# Patient Record
Sex: Female | Born: 1940 | Race: Black or African American | Hispanic: No | Smoking: Never smoker
Health system: Southern US, Community
[De-identification: ages and names within clinical notes are randomized; demographics above are authoritative.]

## PROBLEM LIST (undated history)

## (undated) DIAGNOSIS — M1990 Unspecified osteoarthritis, unspecified site: Secondary | ICD-10-CM

## (undated) DIAGNOSIS — M069 Rheumatoid arthritis, unspecified: Secondary | ICD-10-CM

## (undated) DIAGNOSIS — I10 Essential (primary) hypertension: Secondary | ICD-10-CM

## (undated) DIAGNOSIS — R7303 Prediabetes: Secondary | ICD-10-CM

## (undated) DIAGNOSIS — I2510 Atherosclerotic heart disease of native coronary artery without angina pectoris: Secondary | ICD-10-CM

## (undated) DIAGNOSIS — D649 Anemia, unspecified: Secondary | ICD-10-CM

## (undated) HISTORY — DX: Anemia, unspecified: D64.9

## (undated) HISTORY — PX: PARTIAL HYSTERECTOMY: SHX80

## (undated) HISTORY — DX: Atherosclerotic heart disease of native coronary artery without angina pectoris: I25.10

## (undated) HISTORY — DX: Rheumatoid arthritis, unspecified: M06.9

## (undated) HISTORY — DX: Essential (primary) hypertension: I10

---

## 2002-09-15 ENCOUNTER — Encounter (INDEPENDENT_AMBULATORY_CARE_PROVIDER_SITE_OTHER): Payer: Self-pay | Admitting: Specialist

## 2002-09-15 ENCOUNTER — Encounter: Admission: RE | Admit: 2002-09-15 | Discharge: 2002-09-15 | Payer: Self-pay | Admitting: Family Medicine

## 2002-09-15 ENCOUNTER — Encounter: Payer: Self-pay | Admitting: Family Medicine

## 2003-10-04 ENCOUNTER — Ambulatory Visit (HOSPITAL_COMMUNITY): Admission: RE | Admit: 2003-10-04 | Discharge: 2003-10-04 | Payer: Self-pay | Admitting: Family Medicine

## 2004-10-15 ENCOUNTER — Ambulatory Visit (HOSPITAL_COMMUNITY): Admission: RE | Admit: 2004-10-15 | Discharge: 2004-10-15 | Payer: Self-pay | Admitting: Family Medicine

## 2005-07-06 HISTORY — PX: CORONARY ARTERY BYPASS GRAFT: SHX141

## 2005-09-25 ENCOUNTER — Ambulatory Visit: Payer: Self-pay | Admitting: Cardiology

## 2005-11-05 ENCOUNTER — Ambulatory Visit: Payer: Self-pay | Admitting: Cardiology

## 2005-11-20 ENCOUNTER — Encounter: Payer: Self-pay | Admitting: Vascular Surgery

## 2005-11-20 ENCOUNTER — Ambulatory Visit: Payer: Self-pay | Admitting: Internal Medicine

## 2005-11-20 ENCOUNTER — Ambulatory Visit (HOSPITAL_COMMUNITY): Admission: RE | Admit: 2005-11-20 | Discharge: 2005-11-20 | Payer: Self-pay | Admitting: Cardiothoracic Surgery

## 2005-11-20 ENCOUNTER — Inpatient Hospital Stay (HOSPITAL_BASED_OUTPATIENT_CLINIC_OR_DEPARTMENT_OTHER): Admission: RE | Admit: 2005-11-20 | Discharge: 2005-11-20 | Payer: Self-pay | Admitting: Orthopedic Surgery

## 2005-12-01 ENCOUNTER — Ambulatory Visit (HOSPITAL_COMMUNITY): Admission: RE | Admit: 2005-12-01 | Discharge: 2005-12-01 | Payer: Self-pay | Admitting: Family Medicine

## 2005-12-15 ENCOUNTER — Inpatient Hospital Stay (HOSPITAL_COMMUNITY): Admission: RE | Admit: 2005-12-15 | Discharge: 2005-12-20 | Payer: Self-pay | Admitting: Cardiothoracic Surgery

## 2005-12-23 ENCOUNTER — Encounter: Payer: Self-pay | Admitting: Cardiology

## 2005-12-29 ENCOUNTER — Ambulatory Visit: Payer: Self-pay | Admitting: Cardiology

## 2006-03-03 ENCOUNTER — Encounter: Payer: Self-pay | Admitting: Cardiology

## 2006-10-20 ENCOUNTER — Encounter: Payer: Self-pay | Admitting: Cardiology

## 2006-12-06 ENCOUNTER — Ambulatory Visit (HOSPITAL_COMMUNITY): Admission: RE | Admit: 2006-12-06 | Discharge: 2006-12-06 | Payer: Self-pay | Admitting: Family Medicine

## 2006-12-09 ENCOUNTER — Ambulatory Visit: Payer: Self-pay | Admitting: Cardiology

## 2007-12-13 ENCOUNTER — Ambulatory Visit (HOSPITAL_COMMUNITY): Admission: RE | Admit: 2007-12-13 | Discharge: 2007-12-13 | Payer: Self-pay | Admitting: Family Medicine

## 2008-03-13 ENCOUNTER — Ambulatory Visit: Payer: Self-pay | Admitting: Cardiology

## 2008-12-19 ENCOUNTER — Ambulatory Visit: Payer: Self-pay | Admitting: Cardiology

## 2009-01-02 ENCOUNTER — Ambulatory Visit (HOSPITAL_COMMUNITY): Admission: RE | Admit: 2009-01-02 | Discharge: 2009-01-02 | Payer: Self-pay | Admitting: Family Medicine

## 2009-03-19 DIAGNOSIS — I25810 Atherosclerosis of coronary artery bypass graft(s) without angina pectoris: Secondary | ICD-10-CM

## 2009-03-19 DIAGNOSIS — I10 Essential (primary) hypertension: Secondary | ICD-10-CM | POA: Insufficient documentation

## 2009-05-08 ENCOUNTER — Ambulatory Visit: Payer: Self-pay | Admitting: Cardiology

## 2009-05-08 ENCOUNTER — Encounter: Payer: Self-pay | Admitting: Cardiology

## 2009-05-14 ENCOUNTER — Ambulatory Visit: Payer: Self-pay | Admitting: Cardiology

## 2009-05-14 DIAGNOSIS — D649 Anemia, unspecified: Secondary | ICD-10-CM

## 2009-05-14 DIAGNOSIS — M069 Rheumatoid arthritis, unspecified: Secondary | ICD-10-CM | POA: Insufficient documentation

## 2009-05-14 DIAGNOSIS — R9430 Abnormal result of cardiovascular function study, unspecified: Secondary | ICD-10-CM | POA: Insufficient documentation

## 2009-05-14 DIAGNOSIS — R030 Elevated blood-pressure reading, without diagnosis of hypertension: Secondary | ICD-10-CM | POA: Insufficient documentation

## 2009-07-09 ENCOUNTER — Ambulatory Visit: Payer: Self-pay | Admitting: Cardiology

## 2009-07-09 DIAGNOSIS — E785 Hyperlipidemia, unspecified: Secondary | ICD-10-CM

## 2009-07-22 ENCOUNTER — Encounter: Payer: Self-pay | Admitting: Family Medicine

## 2010-01-02 ENCOUNTER — Telehealth (INDEPENDENT_AMBULATORY_CARE_PROVIDER_SITE_OTHER): Payer: Self-pay | Admitting: *Deleted

## 2010-01-07 ENCOUNTER — Ambulatory Visit (HOSPITAL_COMMUNITY): Admission: RE | Admit: 2010-01-07 | Discharge: 2010-01-07 | Payer: Self-pay | Admitting: Family Medicine

## 2010-05-12 ENCOUNTER — Encounter: Payer: Self-pay | Admitting: Cardiology

## 2010-07-09 ENCOUNTER — Encounter: Payer: Self-pay | Admitting: Cardiology

## 2010-08-05 NOTE — Letter (Signed)
Summary: [facility]  [facility]   Imported By: Maryln Gottron 08/23/2009 15:53:15  _____________________________________________________________________  External Attachment:    Type:   Image     Comment:   External Document

## 2010-08-05 NOTE — Assessment & Plan Note (Signed)
Summary: 6 WK FU -SRS   Visit Type:  Follow-up Primary Provider:  Dimas Aguas  CC:  follow-up visit.  History of Present Illness: the patient is a 56 her old female with history of coronary bypass grafting in June of 2007. She has normal LV function. The patient was seen in November because of an abnormal stress echocardiographic study. However the patient had no chest pain during stress testing and ejection fraction greater than 65%. There was evidence of ischemia in the mid anterior mid inferoseptal and apical segments. However given the absence of symptoms I opted to treat the patient initially medically. Of note was that her blood pressure was also very poorly controlled although she claims she has whitecoat hypertension. We started the patient on amlodipine, Imdur and Pravachol. She states that she is doing very well. She has no chest pain orthopnea PND cough vision or syncope. She has good exercise tolerance.  Clinical Review Panels:  Stress Echocardiogram Stress Echocardiogram Bruce protocol. Patient exercised into stage 209% of maximum predicted heart rate. The patient achieved 4 metastases. There is a 1.5 mm ST segment depression in leads 2, 3, aVF, V5, and V6. The patient denies any chest pain. Hypertensive blood pressure response. Positive echocardiographic stress test.  Positive echocardiographic stress test. Overall normal LV function at peak with ejection fraction of greatest 5%. Mid anterior, mid inferoseptal, apical septal and apical anterior wall segments deteriorated. Echocardiographic findings are consistent with ischemia. (05/08/2009)  Cardiac Imaging Cardiac Cath Findings  ASSESSMENT:  1.  Three-vessel coronary artery disease with a significant left main and      ostial left anterior descending artery disease.  2.  Normal left ventricle function.  3.  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   PLAN:  1.  CVTS consult for bypass  surgery.  She now has an appointment set up for      next week with Dr. Donata Clay.  2.  Risk factor modification.  3.  Continue her beta blocker.  4.  We will add Imdur 30 and simvastatin 40.  5.  Should she have worsening symptoms, she will need to follow up with Dr.      Andee Lineman or be admitted to hospital.   Arvilla Meres, M.D. [facility] (11/20/2005)    Preventive Screening-Counseling & Management  Alcohol-Tobacco     Smoking Status: never  Current Medications (verified): 1)  Fish Oil 1000 Mg Caps (Omega-3 Fatty Acids) .... Take 1 Tablet By Mouth Once A Day 2)  Vitamin B-12 500 Mcg Tabs (Cyanocobalamin) .... Take 1 Tablet By Mouth Once A Day 3)  Aspirin 81 Mg Tbec (Aspirin) .... Take 1 Tablet By Mouth Once A Day 4)  Multivitamins  Tabs (Multiple Vitamin) .... Take One By Mouth 2-3 Times Per Week 5)  Vitamin D3 1000 Unit Caps (Cholecalciferol) .... Take 1 Tablet By Mouth Once A Day 6)  Prilosec Otc 20 Mg Tbec (Omeprazole Magnesium) .... Take 1 Tablet By Mouth Once A Day 7)  Atenolol 25 Mg Tabs (Atenolol) .... Take 1 Tablet By Mouth Two Times A Day 8)  Folic Acid 1 Mg Tabs (Folic Acid) .... Take 1 Tablet By Mouth Once A Day 9)  Methotrexate 2.5 Mg Tabs (Methotrexate Sodium) .... Take 8 Tablets By Mouth Weekly On Wednesdays 10)  Caltrate 600+d 600-400 Mg-Unit Tabs (Calcium Carbonate-Vitamin D) .... Take 1 Tablet By Mouth Two Times A Day 11)  Meloxicam 7.5 Mg Tabs (Meloxicam) .Marland KitchenMarland KitchenMarland Kitchen  Take 1 Tablet By Mouth Once A Day 12)  Soy Isoflavones Extract 50 Mg Caps (Soy Isoflavone) .... Take 1 Tablet By Mouth Once A Day 13)  Amlodipine Besylate 5 Mg Tabs (Amlodipine Besylate) .... Take 1 Tablet By Mouth Once A Day 14)  Isosorbide Mononitrate Cr 30 Mg Xr24h-Tab (Isosorbide Mononitrate) .... Take 1 Tablet By Mouth Once A Day 15)  Pravachol 20 Mg Tabs (Pravastatin Sodium) .... Take 1 Tab By Mouth At Bedtime  Allergies (verified): 1)  ! Pcn  Comments:  Nurse/Medical Assistant: The patient's  medications and allergies were reviewed with the patient and were updated in the Medication and Allergy Lists. Patient brought list to office.  Past History:  Past Medical History: Last updated: 05/14/2009 HYPERTENSION, UNSPECIFIED (ICD-401.9) CAD, ARTERY BYPASS GRAFT (ICD-414.04) History of anemia.  History of rheumatoid arthritis.  Elevated right-sided hemidiaphragm.  1. Coronary artery disease.     a.     Status post coronary artery bypass graft x3 on December 15, 2005.     b.     Preserved left ventricular function. 2. History of anemia, stable. 3. Hypertension (white coat hypertension). 4. History of rheumatoid arthritis.  Past Surgical History: Last updated: 03/19/2009 CABG  Family History: Last updated: 03/19/2009 Family History of Alcoholism:  Family History of Cancer:  Family History of Hypertension:   Social History: Last updated: 03/19/2009 Tobacco Use - No.   Risk Factors: Smoking Status: never (07/09/2009)  Review of Systems  The patient denies fatigue, malaise, fever, weight gain/loss, vision loss, decreased hearing, hoarseness, chest pain, palpitations, shortness of breath, prolonged cough, wheezing, sleep apnea, coughing up blood, abdominal pain, blood in stool, nausea, vomiting, diarrhea, heartburn, incontinence, blood in urine, muscle weakness, joint pain, leg swelling, rash, skin lesions, headache, fainting, dizziness, depression, anxiety, enlarged lymph nodes, easy bruising or bleeding, and environmental allergies.    Vital Signs:  Patient profile:   70 year old female Height:      67 inches Weight:      212 pounds Pulse rate:   66 / minute BP sitting:   139 / 80  (left arm) Cuff size:   large  Vitals Entered By: Carlye Grippe (July 09, 2009 1:09 PM) CC: follow-up visit   Physical Exam  General:  Well developed, well nourished, in no acute distress. Head:  normocephalic and atraumatic Eyes:  PERRLA/EOM intact; conjunctiva and lids  normal. Nose:  no deformity, discharge, inflammation, or lesions Mouth:  Teeth, gums and palate normal. Oral mucosa normal. Neck:  Neck supple, no JVD. No masses, thyromegaly or abnormal cervical nodes. Lungs:  Clear bilaterally to auscultation and percussion. Heart:  Non-displaced PMI, chest non-tender; regular rate and rhythm, S1, S2 without murmurs, rubs or gallops. Carotid upstroke normal, no bruit. Normal abdominal aortic size, no bruits. Femorals normal pulses, no bruits. Pedals normal pulses. No edema, no varicosities. Abdomen:  Bowel sounds positive; abdomen soft and non-tender without masses, organomegaly, or hernias noted. No hepatosplenomegaly. Msk:  Back normal, normal gait. Muscle strength and tone normal. Pulses:  pulses normal in all 4 extremities Extremities:  No clubbing or cyanosis. Neurologic:  Alert and oriented x 3. Skin:  Intact without lesions or rashes. Cervical Nodes:  no significant adenopathy Psych:  Normal affect.   Impression & Recommendations:  Problem # 1:  HYPERTENSION, WHITE COAT (ICD-796.2) blood pressures under significantly better control. We will continue her current medical regimen.  Problem # 2:  NONSPECIFIC ABNORMAL UNSPEC CV FUNCTION STUDY (  ICD-794.30) although the patient abnormal stress study I feel this is low risk and the patient is asymptomatic. There is no immediate indication for cardiac catheterization  Problem # 3:  CAD, ARTERY BYPASS GRAFT (ICD-414.04) Assessment: Comment Only  Her updated medication list for this problem includes:    Aspirin 81 Mg Tbec (Aspirin) .Marland Kitchen... Take 1 tablet by mouth once a day    Atenolol 25 Mg Tabs (Atenolol) .Marland Kitchen... Take 1 tablet by mouth two times a day    Amlodipine Besylate 5 Mg Tabs (Amlodipine besylate) .Marland Kitchen... Take 1 tablet by mouth once a day    Isosorbide Mononitrate Cr 30 Mg Xr24h-tab (Isosorbide mononitrate) .Marland Kitchen... Take 1 tablet by mouth once a day  Orders: EKG w/ Interpretation (93000)  Problem #  4:  ARTHRITIS, RHEUMATOID (ICD-714.0) Assessment: Comment Only  Problem # 5:  DYSLIPIDEMIA (ICD-272.4) patient can followup with her primary care physician regarding his future lipid panel and LFTs. Her updated medication list for this problem includes:    Pravachol 20 Mg Tabs (Pravastatin sodium) .Marland Kitchen... Take 1 tab by mouth at bedtime  Patient Instructions: 1)  Your physician recommends that you continue on your current medications as directed. Please refer to the Current Medication list given to you today. 2)  Follow up in 1 year.   Prescriptions: PRAVACHOL 20 MG TABS (PRAVASTATIN SODIUM) Take 1 tab by mouth at bedtime  #90 x 3   Entered by:   Hoover Brunette, LPN   Authorized by:   Lewayne Bunting, MD, [facility]   Signed by:   Hoover Brunette, LPN on 16/04/9603   Method used:   Electronically to        [facility] (retail)       [address]       [place], [place]  [address]       Ph: [phone]       Fax: [phone]   RxID:   [id number] ISOSORBIDE MONONITRATE CR 30 MG XR24H-TAB (ISOSORBIDE MONONITRATE) Take 1 tablet by mouth once a day  #90 x 3   Entered by:   Hoover Brunette, LPN   Authorized by:   Lewayne Bunting, MD, [facility]   Signed by:   Hoover Brunette, LPN on 05/02/2535   Method used:   Electronically to        [facility] (retail)       [address]       [place], [place]  [address]       Ph: [phone]       Fax: [phone]   RxID:   [id number] AMLODIPINE BESYLATE 5 MG TABS (AMLODIPINE BESYLATE) Take 1 tablet by mouth once a day  #90 x 3   Entered by:   Hoover Brunette, LPN   Authorized by:   Lewayne Bunting, MD, [facility]   Signed by:   Hoover Brunette, LPN on 32/35/5732   Method used:   Electronically to        [facility] (retail)       [address]       [place], [place]  [address]       Ph: [phone]       Fax: [phone]   RxID:   [id number]

## 2010-08-05 NOTE — Letter (Signed)
Summary: External Correspondence/ OFFICE VISIT [facility]  External Correspondence/ [facility]ICE VISIT [facility]   Imported By: Dorise Hiss 05/13/2010 10:46:21  _____________________________________________________________________  External Attachment:    Type:   Image     Comment:   External Document

## 2010-08-05 NOTE — Progress Notes (Signed)
Summary: ?  UTI  Phone Note Call from Patient   Summary of Call: c/o having dark urine, bad odor, feels like may be geginnings of UTI.  States that she did drink cranberry juice yesterday and feels a little better this morning.  Thinks her Amlodipine may be causing this.  Advised not likely considering her symptoms and that she has been on this med since January.   Advised her to call PMD to have urine checked to r/o UTI.  Patient verbalized understanding.  Initial call taken by: Hoover Brunette, LPN,  January 02, 2010 9:14 AM

## 2010-08-05 NOTE — Miscellaneous (Signed)
Summary: EKG  Clinical Lists Changes  Observations: Added new observation of EKG INTERP: normal sinus rhythm. Left atrial enlargement. Nonspecific T-wave changes. (07/09/2009 13:41)      EKG  Procedure date:  07/09/2009  Findings:      normal sinus rhythm. Left atrial enlargement. Nonspecific T-wave changes.

## 2010-08-07 NOTE — Letter (Signed)
Summary: Appointment- Rescheduled  [facility] [address]   [place], [place] [address]   Phone: [phone]  Fax: [phone]     July 09, 2010 MRN: [id number]     Penny [name] [address] [place], [place]  [address]     Dear Ms. Miranda,   Due to a change in our office schedule, your appointment on August 12, 2010 at  1:45 PM must be changed.    Your new appointment will be August 27, 2010 at 2:00 PM.   We look forward to participating in your health care needs.      Sincerely,  Glass blower/designer

## 2010-08-27 ENCOUNTER — Encounter: Payer: Self-pay | Admitting: Cardiology

## 2010-08-27 ENCOUNTER — Ambulatory Visit (INDEPENDENT_AMBULATORY_CARE_PROVIDER_SITE_OTHER): Payer: Medicare Other | Admitting: Cardiology

## 2010-08-27 DIAGNOSIS — I2510 Atherosclerotic heart disease of native coronary artery without angina pectoris: Secondary | ICD-10-CM

## 2010-09-02 NOTE — Assessment & Plan Note (Signed)
Summary: 1 yr fu recv reminder-had to r/s due to EPIC go alive -vts   Visit Type:  Follow-up Primary Provider:  Dimas Aguas   History of Present Illness: The patient is a 70 year old female with a history of bypass surgery in 2007.  Shows normal LV function.  In November of 2010 showed an abnormal stress echocardiographic study.  The patient however had no chest pain and her ejection fraction was greater than 65%.  There was some evidence of ischemia in the mid anterior, mid inferoseptal and apical segments.  However, given the absence of symptoms I opted to treat the patient initially medically.  At that time her blood pressure was also very poorly controlled and she felt that she had white coat hypertension.  She was started on amlodipine, isosorbide mononitrate and Pravachol.  At that time she was doing well.  She has not required any hospitalizations. The patient has a history of hypertension, dyslipidemia and reflux as well as rheumatoid arthritis. The patient has taken herself off amlodipine because of lower extremity edema.  She denies any chest pain, shortness of breath, palpitations, orthopnea or PND.  She does not use any sublingual nitroglycerin.  She has been watching her diet and has lost some weight.  She was instructed by Dr. Dimas Aguas to be careful and to monitor her blood sugar. EKG was reviewed today and was essentially within normal limits   Preventive Screening-Counseling & Management  Alcohol-Tobacco     Smoking Status: never  Current Medications (verified): 1)  Fish Oil 1000 Mg Caps (Omega-3 Fatty Acids) .... Take 1 Tablet By Mouth Once A Day 2)  Vitamin B-12 500 Mcg Tabs (Cyanocobalamin) .... Take 1 Tablet By Mouth Once A Day 3)  Aspirin 81 Mg Tbec (Aspirin) .... Take 1 Tablet By Mouth Once A Day 4)  Multivitamins  Tabs (Multiple Vitamin) .... Take One By Mouth 2-3 Times Per Week 5)  Vitamin D3 1000 Unit Caps (Cholecalciferol) .... Take 1 Tablet By Mouth Once A Day 6)   Prilosec Otc 20 Mg Tbec (Omeprazole Magnesium) .... Take 1 Tablet By Mouth Once A Day 7)  Atenolol 25 Mg Tabs (Atenolol) .... Take 1 Tablet By Mouth Two Times A Day 8)  Folic Acid 1 Mg Tabs (Folic Acid) .... Take 1 Tablet By Mouth Once A Day 9)  Methotrexate 2.5 Mg Tabs (Methotrexate Sodium) .... Take 8 Tablets By Mouth Weekly On Wednesdays 10)  Caltrate 600+d 600-400 Mg-Unit Tabs (Calcium Carbonate-Vitamin D) .... Take 1 Tablet By Mouth Two Times A Day 11)  Meloxicam 7.5 Mg Tabs (Meloxicam) .... Take 1 Tablet By Mouth Once A Day 12)  Soy Isoflavones Extract 50 Mg Caps (Soy Isoflavone) .... Take 1 Tablet By Mouth Once A Day 13)  Isosorbide Mononitrate Cr 30 Mg Xr24h-Tab (Isosorbide Mononitrate) .... Take 1 Tablet By Mouth Once A Day 14)  Pravachol 20 Mg Tabs (Pravastatin Sodium) .... Take 1 Tab By Mouth At Bedtime  Allergies: 1)  ! Pcn 2)  * Norvasc  Comments:  Nurse/Medical Assistant: The patient's medications were reviewed with the patient and were updated in the Medication List. Pt brought a list of medications to office visit.  Cyril Loosen, RN, BSN (August 27, 2010 2:24 PM)  Past History:  Past Medical History: Last updated: 05/14/2009 HYPERTENSION, UNSPECIFIED (ICD-401.9) CAD, ARTERY BYPASS GRAFT (ICD-414.04) History of anemia.  History of rheumatoid arthritis.  Elevated right-sided hemidiaphragm.  1. Coronary artery disease.     a.     Status post coronary  artery bypass graft x3 on December 15, 2005.     b.     Preserved left ventricular function. 2. History of anemia, stable. 3. Hypertension (white coat hypertension). 4. History of rheumatoid arthritis.  Past Surgical History: Last updated: 03/19/2009 CABG  Family History: Last updated: 03/19/2009 Family History of Alcoholism:  Family History of Cancer:  Family History of Hypertension:   Social History: Last updated: 03/19/2009 Tobacco Use - No.   Risk Factors: Smoking Status: never  (08/27/2010)  Vital Signs:  Patient profile:   70 year old female Height:      67 inches Weight:      205.50 pounds BMI:     32.30 Pulse rate:   69 / minute BP sitting:   128 / 80  (left arm) Cuff size:   large  Vitals Entered By: Cyril Loosen, RN, BSN (August 27, 2010 2:21 PM)  Nutrition Counseling: Patient's BMI is greater than 25 and therefore counseled on weight management options. Comments No cardiac complaints   Physical Exam  Additional Exam:  General: Well-developed, well-nourished in no distress head: Normocephalic and atraumatic eyes PERRLA/EOMI intact, conjunctiva and lids normal nose: No deformity or lesions mouth normal dentition, normal posterior pharynx neck: Supple, no JVD.  No masses, thyromegaly or abnormal cervical nodes lungs: Normal breath sounds bilaterally without wheezing.  Normal percussion heart: regular rate and rhythm with normal S1 and S2, no S3 or S4.  PMI is normal.  No pathological murmurs abdomen: Normal bowel sounds, abdomen is soft and nontender without masses, organomegaly or hernias noted.  No hepatosplenomegaly musculoskeletal: Back normal, normal gait muscle strength and tone normal pulsus: Pulse is normal in all 4 extremities Extremities: No peripheral pitting edema neurologic: Alert and oriented x 3 skin: Intact without lesions or rashes cervical nodes: No significant adenopathy psychologic: Normal affect    EKG  Procedure date:  08/28/2010  Findings:      normal sinus rhythm.  Nonspecific T-wave changes.  Heart rate 65 beats/min  Impression & Recommendations:  Problem # 1:  CAD, ARTERY BYPASS GRAFT (ICD-414.04) stable no recurrent symptoms.  EKG no acute changes.abnormal myocardial perfusion study 2010: Medical treatment, nor recurrent substernal chest pain. normal LV function  The following medications were removed from the medication list:    Amlodipine Besylate 5 Mg Tabs (Amlodipine besylate) .Marland Kitchen... Take 1 tablet by  mouth once a day Her updated medication list for this problem includes:    Aspirin 81 Mg Tbec (Aspirin) .Marland Kitchen... Take 1 tablet by mouth once a day    Atenolol 25 Mg Tabs (Atenolol) .Marland Kitchen... Take 1 tablet by mouth two times a day    Isosorbide Mononitrate Cr 30 Mg Xr24h-tab (Isosorbide mononitrate) .Marland Kitchen... Take 1 tablet by mouth once a day  Orders: EKG w/ Interpretation (93000)  Problem # 2:  HYPERTENSION, WHITE COAT (ICD-796.2) hypertension: Controlled patient discontinued the amlodipine herself because of lower extremity edema  Problem # 3:  ARTHRITIS, RHEUMATOID (ICD-714.0) rheumatoid arthritis: Blood work followed regularly because of the use of methotrexate  Problem # 4:  DYSLIPIDEMIA (ICD-272.4) follow-up liver function test and LFTs per her primary care physician. Her updated medication list for this problem includes:    Pravachol 20 Mg Tabs (Pravastatin sodium) .Marland Kitchen... Take 1 tab by mouth at bedtime  Patient Instructions: 1)  Your physician recommends that you continue on your current medications as directed. Please refer to the Current Medication list given to you today. 2)  Follow up in  6 months

## 2010-11-18 NOTE — Assessment & Plan Note (Signed)
[facility]                          [facility] OFFICE NOTE   NAME:Penny [name], Penny [name]                    MRN:          [id number]  DATE:12/19/2008                            DOB:          Nov 09, 1940    HISTORY OF PRESENT ILLNESS:  The patient is a 70 year old female with a  history of coronary artery disease status post coronary artery bypass  surgery.  Her surgery was in June 2007.  The patient is due for a stress  test this year, which she likes to delay until September in the next  couple of months.  She also has a history of rheumatoid arthritis and  anemia and it is followed by Dr. Jimmy Footman.  She states that her blood  work has been stable.  She denies any chest pain, shortness of breath,  orthopnea, or PND.  She states that she has resumed cardiac rehab.  Initially, her exercise tolerance had decreased, but she feels now that  it is much better.  She is even able to mow her lawn with a push mower.   MEDICATIONS:  1. Omega-3 fish oil 1000 mg p.o. daily.  2. Vitamin B12 500 mcg p.o. daily.  3. Aspirin 81 mg p.o. daily.  4. Complete multivitamin 1 tablet 2-3 times a week.  5. Calcium citrate plus vitamin D 500 international units daily.  6. vitamin D3 1000 international units daily.  7. Prilosec over-the-counter.  8. Atenolol 100 mg half tablet p.o. daily.  9. Folic acid 20 mg p.o. daily.  10.Methotrexate 8 tablets on Wednesday.   PHYSICAL EXAMINATION:  VITAL SIGNS:  Blood pressure 155/90, heart rate  is 67, weights 212 pounds.  GENERAL:  A well-nourished African American female in no apparent  distress.  HEENT:  Pupils are isocoric.  Conjunctivae are clear.  NECK:  Supple.  Normal carotid upstroke and no carotid bruits.  LUNGS:  Clear breath sounds bilaterally.  HEART:  Regular rate and rhythm.  Normal S1 and S2.  No murmur, rubs, or  gallops.  ABDOMEN:  Soft.  EXTREMITIES:  No cyanosis, clubbing, or edema.  NEURO:  The patient is  alert, oriented, and grossly nonfocal.   PROBLEM LIST:  1. Coronary artery disease.      a.     Status post coronary artery bypass graft x3 on December 15, 2005.      b.     Preserved left ventricular function.  2. History of anemia, stable.  3. Hypertension (white coat hypertension).  4. History of rheumatoid arthritis.   PLAN:  1. From a cardiac standpoint, the patient appears to be doing well.      Her exercise tolerance has improved.  She is, however, due for a      stress test which could be a stress echo or dobutamine echo in      September 2010.  She wants to delay it as she is going on a cruise.      The patient told that she would call back to schedule this.  2. The patient can follow up with Dr.  Howard regarding her routine      blood work and as well as Dr. Jimmy Footman.  She states that her      hemoglobin has been stable.  3. The patient did have an elevated blood pressure in the office      today, but she takes her blood pressure regularly at home.  It has      been within normal limits.     Learta Codding, MD,FACC  Electronically Signed    GED/MedQ  DD: 12/19/2008  DT: 12/19/2008  Job #: [id number]   cc:   Selinda Flavin

## 2010-11-18 NOTE — Assessment & Plan Note (Signed)
[facility]                          [facility]ICE NOTE   NAME:Penny [name], Penny [name]                    MRN:          [id number]  DATE:12/09/2006                            DOB:          Dec 20, 1940    HISTORY OF PRESENT ILLNESS:  Patient is a 70 year old female with a  history of multi vessel coronary artery disease. Patient is status post  coronary artery bypass graft times three on December 15, 2005.  The patient  has been doing quite well. She has occasional sharp pains in the chest  which are not exertional related. The patient is very active and walks  frequently. This does not cause any substernal chest pain. She states  that she does feel tight upon deep breath but she relates this to  sternotomy pain. She has no palpitations or syncope. Her EKG in the  office today shows no acute ischemic changes.   The patient told me that she can not take Zocor or what appears to be  any other statin, due to rash. Dr. Dimas Aguas, however is following this  closely and has recommended the patient's LDL to be below 100 mg  percent. Follow up laboratory work has been ordered reportedly.   MEDICATIONS:  1. Folic acid 20 mg a day.  2. Aspirin 81 mg a day.  3. __________.  4. Multivitamin.  5. Methotrexate q. week.  6. Metoprolol XL 50 mg p.o. daily.  7. __________  b.i.d.   PHYSICAL EXAMINATION:  VITAL SIGNS: Blood pressure 129/91, heart rate  76, weight 197 pounds.  GENERAL: Well-nourished African American female in no apparent distress.  NECK EXAM: Normal carotid upstrokes, no carotid bruits.  LUNGS: Clear breath sounds bilaterally.  HEART: Regular rate and rhythm with normal S1, S2.  ABDOMEN: Soft.  EXTREMITY EXAM: No cyanosis, clubbing, or edema.  NEURO: Patient alert, and oriented, and grossly nonfocal.   PROBLEM LIST:  1. Coronary artery disease.      a.     Status post coronary artery bypass graft times 3 December 15, 2005.      b.      Abnormal Cardiolite stress test.      c.     __________ systolic function.  2. History of anemia.  3. History of hypertension.  4. History of rheumatoid arthritis.  5. Elevated right-sided hemidiaphragm.   PLAN:  1. The patient is doing quite well. She does not need any ischemic      testing.  2. The patient's chest pain is atypical and it relates to her      sternotomy scar. I reassured her about it.  3. Dr. Dimas Aguas will follow the patient's lipid panel closely.     Learta Codding, MD,FACC  Electronically Signed    GED/MedQ  DD: 12/09/2006  DT: 12/09/2006  Job #: [id number]   cc:   Selinda Flavin

## 2010-11-18 NOTE — Assessment & Plan Note (Signed)
[facility]                          [facility]ICE NOTE   NAME:[name] [name] ROUGHT                    MRN:          [id number]  DATE:03/13/2008                            DOB:          28-Nov-1940    HISTORY OF PRESENT ILLNESS:  The patient is a 70 year old female with a  history of multivessel coronary artery disease status post coronary  artery bypass grafting in June 2007.  The patient continues to do well.  She has occasional sternotomy pains, but no angina.  She is very active  and walks frequently.  She denies any orthopnea or PND.  She has no  palpitations or syncope.  She is requesting if she can use meloxicam  p.r.n.   MEDICATIONS:  1. Folic acid 1 mg p.o. daily.  2. Aspirin 81 mg p.o. daily.  3. Caltrate daily.  4. Multivitamin.  5. Methotrexate 2.5 mg every week.  6. Metoprolol XL 50 mg p.o. b.i.d.  7. Vitamin B12.  8. Green tea.   PHYSICAL EXAMINATION:  VITAL SIGNS:  Blood pressure 149/85, heart rate  66, and weight is 214 pounds.  NECK:  Normal carotid upstroke and no carotid bruits.  LUNGS:  Clear breath sounds bilaterally.  HEART:  Regular rate and rhythm with normal S1 and S2.  No murmur, rubs,  or gallops.  ABDOMEN:  Soft and nontender.  No rebound or guarding.  Good bowel  sounds.  EXTREMITIES:  No cyanosis, clubbing, or edema.  NEURO:  The patient is alert, oriented, and grossly nonfocal.   PROBLEM LIST:  1. Coronary artery disease.      a.     Status post current bypass grafting x3 on December 15, 2005.      b.     Preserved left ventricular systolic function.  2. History of anemia.  3. History of hypertension.  4. History of rheumatoid arthritis.   PLAN:  1. The patient is doing well.  I told her that she can on p.r.n. basis      use meloxicam for now.  I would prefer, however, for her to use      Tylenol.  2. We reviewed her EKG in the office, and this was essentially      unchanged.  There is evidence  of a small anteroseptal infarct pattern by EKG, which appears to be      old.  3. The patient can follow up with me in the next couple of months.      [name] Codding, MD,FACC  Electronically Signed    GED/MedQ  DD: 03/13/2008  DT: 03/14/2008  Job #: [id number]   cc:   Selinda Flavin

## 2010-11-21 NOTE — Discharge Summary (Signed)
NAMEEVERETTE, DIMAURO             ACCOUNT NO.:  [id number]   MEDICAL RECORD NO.:  [id number]          PATIENT TYPE:  INP   LOCATION:  2033                         FACILITY:  [facility]   PHYSICIAN:  Kerin Perna, M.D.  DATE OF BIRTH:  [birth year]   DATE OF ADMISSION:  12/15/2005  DATE OF DISCHARGE:                                 DISCHARGE SUMMARY   ADMISSION DIAGNOSIS:  Class-III progressive angina with left main and severe  coronary artery disease.   DISCHARGE DIAGNOSES:  1.  Coronary artery disease status post coronary artery bypass graft x3.  2.  Volume overload.  3.  Acute blood-loss anemia.  4.  Hypertension.  5.  Rheumatoid arthritis, on methotrexate.   CARDIOLOGIST:  Arvilla Meres, M.D. [facility].   PRIMARY CARE PHYSICIAN:  Selinda Flavin, MD   CONSULTATIONS:  None.   PROCEDURES:  On December 15, 2005, the patient underwent coronary artery bypass  grafting x3 (left internal mammary artery to the left anterior descending,  saphenous vein graft to the ramus intermedius, saphenous vein graft to the  circumflex marginal), EVH via the right leg, by Dr. Kathlee Nations Trigt.   HISTORY AND PHYSICAL:  This is a 70 year old obese African American female  for potential surgical coronary revascularization for recently diagnosed  severe 2-vessel coronary artery disease.  The patient has had recent onset  of exertional chest pain relieved by rest, progressive since earlier this  spring.  She states this is a tight sensation in her chest with associated  diaphoresis, but no radiation of pain to her extremities or associated  nausea.  The patient states the symptoms have always come on with activity  or exertion and have not occurred at night or at rest.  Typical activities  that have precipitated the pain include mowing the lawn or walking rapidly.  The patient denies any orthopnea, PND or ankle edema.  A stress test was  performed that shows no ST segment ischemic changes.  However, the  perfusion  study showed no evidence of ischemia.  A follow-up cardiac catheterization  was performed by Dr. Gala Romney, and this demonstrated 70% left main  stenosis, high-grade stenosis to the LAD and diagonal and high-grade  stenosis to the ramus intermedius with a 50% stenosis to the right coronary  artery.  EF was 65%.  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  Based on her coronary anatomy and symptoms, she was felt to be a  candidate for surgical revascularization.  The patient was electively  admitted to [facility] on December 15, 2005, for bypass surgery by Dr.  Donata Clay.  The risks and benefits were explained to the patient and she  agreed to proceed.  The patient's risk factors include hypertension,  hyperlipidemia and obesity.   HOSPITAL STAY:  Postoperatively, the patient has progressed as expected.  The patient was extubated the evening of her surgery.  She remained in  normal sinus rhythm with vital signs stable.  The patient had a chest tube  output low.  Her urine output was greater than 350 cc per hour.  The patient  was hemodynamically stable.  On post-op day #1 the patient was  neurologically intact.  She did have some volume overload and was being  diuresed with Lasix and having her potassium replaced with potassium  chloride.  The patient has continued to have some extra fluid throughout her  stay.  She will continue on Lasix until she is diuresed appropriately.  The  patient did have some acute blood loss anemia; however, she did not require  any blood transfusions and her hemoglobin and hematocrit and had remained  stable.  The patient had some hypokalemia on post-op day #2.  She was  replaced with potassium chloride.  Follow-up BMP will be checked in December 18, 2005.  The patient is not diabetic; however, her blood sugars have been  controlled on a sliding scale of insulin.   The patient is ambulating daily with cardiac rehab.  She is  continuing her  breathing exercises.  The patient was transferred to 2000 on post-op day #2  appropriately.   PHYSICAL EXAMINATION:  VITAL SIGNS:  Blood pressure 112/62, heart rate 81,  respirations 20, temperature 97.1, O2 saturation 99% on 2 liters, I&O output  -400, weight 96 kg (pre-op weight 93 kg).  The patient has CVTs of  104/145/153.  On telemetry the patient is in normal sinus rhythm at 93 beats  per minute.  CARDIAC:  Regular rate and rhythm.  LUNGS:  Decreased breath sounds at the bases.  ABDOMEN:  Bowel sounds positive, soft, nontender, nondistended.  EXTREMITIES:  1+ nonpitting edema.  Chest and leg incisions clear, dry and  intact.   LABORATORY DATA:  Labs pending.  Chest x-ray showed bibasilar atelectasis.   The patient will be discharged home in the next few days.  She remains  stable.  Her oxygen will be weaned to keep her O2 saturations greater than  92%.  The patient will have a stool softener to help with a bowel movement.   CONDITION ON DISCHARGE:  Stable.   DISPOSITION:  The patient will be discharged to home.   MEDICATIONS:  1.  Aspirin 81 mg daily.  2.  Atenolol 25 mg b.i.d.  3.  Zocor 40 mg p.o. daily.  4.  Ultram 1-2 tabs every 4 hours p.r.n.  5.  Lasix 40 mg p.o. daily x5 days.  6.  Potassium chloride 20 mEq p.o. daily x5 days.  7.  Multivitamin daily.  8.  Folic acid 1 mg p.o. daily.  9.  Prilosec 20 mg p.o. daily.  10. Methotrexate 2.5 mg 7 tablets every Wednesday.   DISCHARGE INSTRUCTIONS:  The patient was instructed to follow a low-fat, low-  salt diet.  No driving or heavy lifting greater than 10 pounds for 3 weeks.  The patient is ambulate 3-4 times daily and increase activity as tolerated.  She is to continue her breathing exercises.  The patient may shower and  clean her wounds with mild soap and water.  She is to call the office if any  wound problems shall arise such as erythema, drainage, temperature greater  than 101.5.  FOLLOWUP:   The patient has a follow-up appointment with Dr. Donata Clay on  January 08, 2006, at 11:00 a.m.  She is to call Dr. Gala Romney for an appointment  in 2 weeks, where she will have a chest x-ray taken.      Constance Holster, [place]      Kerin Perna, M.D.     JMW/MEDQ  D:  12/18/2005  T:  12/18/2005  Job:  [id number]   cc:   Arvilla Meres, M.D. [facility]  [facility]  [address]  [place]  [place] [address]   Selinda Flavin  Fax: [phone]

## 2010-11-21 NOTE — Op Note (Signed)
Penny [name], STECH             ACCOUNT NO.:  [id number]   MEDICAL RECORD NO.:  [id number]          PATIENT TYPE:  INP   LOCATION:  2302                         FACILITY:  [facility]   PHYSICIAN:  Kerin Perna, M.D.  DATE OF BIRTH:  June 15, 1941   DATE OF PROCEDURE:  12/15/2005  DATE OF DISCHARGE:                                 OPERATIVE REPORT   OPERATION:  Coronary bypass grafting x3 (left internal mammary artery to  left anterior descending, saphenous vein graft to ramus intermediate,  saphenous vein graft to circumflex marginal).   PREOPERATIVE DIAGNOSIS:  Class III progressive angina with left main and  severe coronary disease.   POSTOPERATIVE DIAGNOSIS:  Class III progressive angina with left main and  severe coronary disease.   SURGEON:  Kerin Perna, M.D.   ASSISTANT:  Constance Holster, P.A.-C.   ANESTHESIA:  General.   INDICATIONS:  The patient is a 70 year old overweight black female who  presents with exertional chest pain and shortness of breath.  Cardiac  catheterization by Bensimhon demonstrated significant left main and severe  coronary artery disease.  Her LVEF was preserved.  She was felt to be  candidate for surgical evaluation.   Prior to surgery, I examined the patient in the office and reviewed the  results of the cardiac cath with the patient.  I discussed the indications  and expected benefits of coronary bypass surgery for treatment of coronary  artery disease.  I reviewed the alternatives to surgical therapy, as well.  I discussed with Penny [name] the major details of the surgery including the  location of the surgical incisions, use of general anesthesia and  cardiopulmonary bypass, the expected postoperative recovery, and the  associated risks of surgery.  The specific risks we reviewed included risks  of MI, CVA, bleeding, infection, and death.  After reviewing these issues,  she demonstrated her understanding and agreed to proceed with operation  as  planned under what I felt was an informed consent.   OPERATIVE FINDINGS:  The patient's saphenous vein was harvested from the  right thigh.  Below the right knee, it was too small to be used.  The left  thigh was exposed but the saphenous vein was too small to be used in the  left leg.  The mammary artery was a small vessel approximately 1 mm but had  adequate flow.  The coronaries were small, intramyocardial, and severely  diseased.  The LV function appeared to be within normal limits.  The patient  required 2 units of packed cells on bypass for a hemoglobin less than 7  grams.  She was given FFP and platelets after reversal of protamine for  persistent coagulopathy.  Her baseline ACT was over 185, and for that reason  the aprotinin protocol was used for this patient.   PROCEDURE:  The patient was brought to the operating room and placed supine  on the operating table.  General anesthesia was induced under invasive  hemodynamic monitoring.  The chest, abdomen, and both legs were prepped with  Betadine and draped as a sterile field.  A sternal  incision was made as the  saphenous vein was harvested endoscopically from the right leg.  The left  internal mammary artery was harvested as a pedicle graft from its origin at  the subclavian vessels.  It was a small vessel but had good flow.  The  sternal retractor was placed using the deep blades due to the patient's  obese body habitus.  The pericardium was opened and suspended.  Pursestrings  were placed in the ascending aorta and right atrium.  The ACT was documented  as being therapeutic for bypass.  The patient was cannulated and placed on  bypass.  The coronaries were identified for grafting.  The right coronary  had some moderate disease, however the arteriogram showed a stenosis of only  30-40%.  This vessel was not grafted because of the non-significant disease  as well as no available conduit.  The distal circumflex was too small  to  graft.  The ramus intermediate was 1.5 mm intramyocardial vessel which was a  good target.  The OM1 was a 1.2 mm vessel which was also intramyocardial and  was an adequate target.  The LAD was diffusely diseased with calcium and the  anastomosis was placed and the LAD was approximately 1.5 mm in diameter.  When the mammary and vein grafts were prepared for the distal anastomoses  and the cardioplegia cannulas had been placed, the patient was cooled to 32  degrees and the aortic crossclamp was applied.  800 mL of cold blood  cardioplegia was delivered in split doses between the antegrade aortic and  retrograde coronary sinus catheters.  There was good cardioplegic arrest and  septal temperature dropped to less than 14 degrees.  Topical iced saline was  used to augment myocardial preservation and a pericardial insulator pad was  used to protect left phrenic nerve.   The distal coronary anastomoses were then performed.  The first distal  anastomosis was to the ramus intermediate.  This is a 1.5-mm vessel which  was intramyocardial and a proximal calcified 90% stenosis.  A reversed  saphenous vein was sewn end-to-side with running 7-0 Prolene, there was  excellent flow through the graft.  The second distal anastomosis was to the  OM1 branch of the circumflex.  This a smaller 1.2 mm vessel with proximal  80% stenosis.  A reversed saphenous vein sewn end-to-side with a running 8-0  Prolene.  There is good flow through the graft.  Cardioplegia was redosed.  The third distal anastomosis was placed to the midportion of the LAD.  It  was a 1.5-mm vessel.  The proximal LAD had a 80-90% stenosis.  The left IMA  pedicle was brought through an opening created in the left lateral  pericardium and was brought down onto the LAD and sewn end-to-side with a  running 8-0 Prolene.  The mammary artery pedicle clamp was briefly removed and there is good flow through the anastomosis.  The pedicle clamp was   replaced and the anastomosis tied down.  The pedicle was secured to the  epicardium with two 6-0 Prolene sutures.   The cardioplegia was redosed.  While the crossclamp was still in place, a  proximal vein anastomosis was performed using the ramus intermediate vein  which was the best quality large caliber vein.  It was sewn end-to-side to  the ascending aorta with a 4.0-mm punch and running 6-0 Prolene.  The vein  graft to the OM1 was sewn to the hood of this vein graft after the  crossclamp was removed.  Prior to releasing the crossclamp, air was vented  from the coronaries and the left side of the heart using a dose of  retrograde warm blood cardioplegia and the usual de-airing maneuvers on  bypass.  As the proximal anastomosis was then tied down, the crossclamp was  removed.  The heart was cardioverted back to a regular rhythm.  Air was  aspirated from the vein graft with a 27 gauge needle.  The cardioplegia  cannulae were removed.  A natural Y graft was then created to place the  proximal anastomosis of the OM vein graft to the hood of the ramus  intermediate graft which had been previously anastomosed to the ascending  aorta.  After this anastomosis was accomplished, the bulldog clamps were  removed and there was good flow through all branches of this configuration  with no bleeding.   The patient was rewarmed to 37 degrees.  The temporary pacing wires were  applied.  The patient was reperfused and rewarmed.  When the patient was  adequately rewarmed, she was weaned from bypass without a pacemaker or  inotropes.  Blood pressure and cardiac output were stable.  Protamine was  administered and there was no adverse reaction.  The cannulae was removed.  The mediastinum was irrigated with warm antibiotic irrigation.  The patient  had significant persistent coagulopathy after protamine was reversed and the  patient was given some platelets and FFP with improved coagulation function.  The  leg incision was irrigated and closed in a standard fashion.  The  superior pericardium was closed over the aorta.  Two mediastinal and a left  pleural chest tube were placed and  brought out through separate incisions.  The sternum was closed with  interrupted steel wire.  The pectoralis fascia was closed with a running #1  Vicryl.  The subcutaneous and skin layers were closed in running Vicryl and  sterile dressings were applied.  Total bypass time was 100 minutes with  crossclamp time of 60 minutes.      Kerin Perna, M.D.  Electronically Signed     PV/MEDQ  D:  12/15/2005  T:  12/15/2005  Job:  [id number]

## 2010-11-21 NOTE — Cardiovascular Report (Signed)
NAMESHAKARA, TWEEDY             ACCOUNT NO.:  [id number]   MEDICAL RECORD NO.:  [id number]          PATIENT TYPE:  OIB   LOCATION:  1963                         FACILITY:  [facility]   PHYSICIAN:  Arvilla Meres, M.D. LHCDATE OF BIRTH:  08/01/1940   DATE OF PROCEDURE:  11/20/2005  DATE OF DISCHARGE:                              CARDIAC CATHETERIZATION   PRIMARY CARE PHYSICIAN:  Dr. Selinda Flavin.   CARDIOLOGIST:  Learta Codding, M.D. [facility].   PATIENT IDENTIFICATION:  Ms. Moffitt is a very pleasant 70 year old woman  with a history of hypertension but no known coronary artery disease.  She  has a several week history of exertional chest discomfort.  She had an  adenosine Cardiolite which had positive EKG but negative perfusion study.  She is referred for cardiac catheterization in the outpatient  catheterization laboratory.   PROCEDURES PERFORMED:  1.  Selective coronary angiography.  2.  Left heart cath.  3.  Left ventriculogram.  4.  Abdominal aortogram.   DESCRIPTION OF PROCEDURE:  The risks and benefits of catheterization were  explained, consent was signed and placed on chart.  A 4-French arterial  sheath was placed in right femoral artery using a modified Seldinger  technique.  Standard catheters including JL-4, 3-DRC, and an angled pigtail  were used for procedure.  All catheter exchanges were made over a wire.  There were no apparent complications.   1.  Central aortic pressure 142/70 with mean of 102.  2.  LV was 129/4 with an EDP of 15.  3.  There was no aortic stenosis.   1.  The left main left main had a 30% mid lesion and a 70% distal lesion.  2.  The LAD was a long vessel wrapping the apex.  There was a 70% ostial      lesion contiguous with the left main lesion.  The mid LAD had moderate      diffuse disease with a 70% prior to a large diagonal, and a 50-60%      diffuse lesion after the diagonal.  The there was a moderate to large      diagonal in the midsection  and a small diagonal following that.  3.  The left circumflex was a moderate-to-large system.  It gave off a large      ramus, a large OM1, and a small OM-2.  In the ramus there is a 95%      ostial lesion contiguous of the left main lesion.  There is a 70% in the      mid section.  In the mid left circumflex, there is a 50% lesion.  In the      proximal portion of the large OM-1, there is a 50% lesion.  4.  The right coronary artery was a large dominant vessel that gave off a      right ventricular branch, a large PDA, and a moderate size PL.  There is      a 30% proximal lesion and a 40-50% mid lesion just before the takeoff of      the RV branch.  LEFT VENTRICULOGRAM:  Done in the RAO position showed an EF of 65% with no  wall motion abnormalities.  There was some mitral regurgitation on the  PVCs  but with the normal beats did not appear to be any significant mitral  regurgitation.   ABDOMINAL AORTOGRAM:  Showed single renal arteries bilaterally.  There is 40-  50% lesion in the ostium of the left renal artery.  There was no abdominal  aorta aortic aneurysm.   ASSESSMENT:  1.  Three-vessel coronary artery disease with a significant left main and      ostial left anterior descending artery disease.  2.  Normal left ventricle function.  3.  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   PLAN:  1.  CVTS consult for bypass surgery.  She now has an appointment set up for      next week with Dr. Donata Clay.  2.  Risk factor modification.  3.  Continue her beta blocker.  4.  We will add Imdur 30 and simvastatin 40.  5.  Should she have worsening symptoms, she will need to follow up with Dr.      Andee Lineman or be admitted to hospital.      Arvilla Meres, M.D. [facility]  Electronically Signed     DB/MEDQ  D:  11/20/2005  T:  11/20/2005  Job:  [id number]   cc:   Selinda Flavin  Fax: [phone]   Learta Codding, M.D. [facility]  [address]  [address]   [place]  [place] [address]

## 2010-12-23 ENCOUNTER — Other Ambulatory Visit (HOSPITAL_COMMUNITY): Payer: Self-pay | Admitting: Family Medicine

## 2010-12-23 DIAGNOSIS — Z139 Encounter for screening, unspecified: Secondary | ICD-10-CM

## 2011-01-15 ENCOUNTER — Ambulatory Visit (HOSPITAL_COMMUNITY)
Admission: RE | Admit: 2011-01-15 | Discharge: 2011-01-15 | Disposition: A | Discharge status: Home / Self Care | Payer: Medicare Other | Source: Ambulatory Visit | Attending: Family Medicine | Admitting: Family Medicine

## 2011-01-15 DIAGNOSIS — Z1231 Encounter for screening mammogram for malignant neoplasm of breast: Secondary | ICD-10-CM | POA: Insufficient documentation

## 2011-01-15 DIAGNOSIS — Z139 Encounter for screening, unspecified: Secondary | ICD-10-CM

## 2011-12-14 ENCOUNTER — Other Ambulatory Visit (HOSPITAL_COMMUNITY): Payer: Self-pay | Admitting: Family Medicine

## 2011-12-14 DIAGNOSIS — Z139 Encounter for screening, unspecified: Secondary | ICD-10-CM

## 2012-01-20 ENCOUNTER — Encounter: Payer: Self-pay | Admitting: *Deleted

## 2012-01-26 ENCOUNTER — Ambulatory Visit (HOSPITAL_COMMUNITY)
Admission: RE | Admit: 2012-01-26 | Discharge: 2012-01-26 | Disposition: A | Discharge status: Home / Self Care | Payer: Medicare Other | Source: Ambulatory Visit | Attending: Family Medicine | Admitting: Family Medicine

## 2012-01-26 DIAGNOSIS — Z1231 Encounter for screening mammogram for malignant neoplasm of breast: Secondary | ICD-10-CM | POA: Insufficient documentation

## 2012-01-26 DIAGNOSIS — Z139 Encounter for screening, unspecified: Secondary | ICD-10-CM

## 2012-12-20 ENCOUNTER — Other Ambulatory Visit (HOSPITAL_COMMUNITY): Payer: Self-pay | Admitting: Family Medicine

## 2012-12-20 DIAGNOSIS — Z139 Encounter for screening, unspecified: Secondary | ICD-10-CM

## 2013-01-26 ENCOUNTER — Ambulatory Visit (HOSPITAL_COMMUNITY)
Admission: RE | Admit: 2013-01-26 | Discharge: 2013-01-26 | Disposition: A | Discharge status: Home / Self Care | Payer: Medicare Other | Source: Ambulatory Visit | Attending: Family Medicine | Admitting: Family Medicine

## 2013-01-26 DIAGNOSIS — Z139 Encounter for screening, unspecified: Secondary | ICD-10-CM

## 2013-01-26 DIAGNOSIS — Z1231 Encounter for screening mammogram for malignant neoplasm of breast: Secondary | ICD-10-CM | POA: Insufficient documentation

## 2013-12-19 ENCOUNTER — Other Ambulatory Visit (HOSPITAL_COMMUNITY): Payer: Self-pay | Admitting: Family Medicine

## 2013-12-19 DIAGNOSIS — Z1231 Encounter for screening mammogram for malignant neoplasm of breast: Secondary | ICD-10-CM

## 2014-01-31 ENCOUNTER — Ambulatory Visit (HOSPITAL_COMMUNITY)
Admission: RE | Admit: 2014-01-31 | Discharge: 2014-01-31 | Disposition: A | Discharge status: Home / Self Care | Payer: Medicare Other | Source: Ambulatory Visit | Attending: Family Medicine | Admitting: Family Medicine

## 2014-01-31 DIAGNOSIS — Z1231 Encounter for screening mammogram for malignant neoplasm of breast: Secondary | ICD-10-CM | POA: Diagnosis present

## 2015-01-01 ENCOUNTER — Other Ambulatory Visit (HOSPITAL_COMMUNITY): Payer: Self-pay | Admitting: Family Medicine

## 2015-01-01 DIAGNOSIS — Z1231 Encounter for screening mammogram for malignant neoplasm of breast: Secondary | ICD-10-CM

## 2015-02-04 ENCOUNTER — Ambulatory Visit (HOSPITAL_COMMUNITY): Payer: BC Managed Care – PPO

## 2015-02-04 ENCOUNTER — Ambulatory Visit (HOSPITAL_COMMUNITY)
Admission: RE | Admit: 2015-02-04 | Discharge: 2015-02-04 | Disposition: A | Discharge status: Home / Self Care | Payer: Medicare Other | Source: Ambulatory Visit | Attending: Family Medicine | Admitting: Family Medicine

## 2015-02-04 DIAGNOSIS — Z1231 Encounter for screening mammogram for malignant neoplasm of breast: Secondary | ICD-10-CM | POA: Insufficient documentation

## 2016-01-13 ENCOUNTER — Other Ambulatory Visit (HOSPITAL_COMMUNITY): Payer: Self-pay | Admitting: Family Medicine

## 2016-01-13 DIAGNOSIS — Z1231 Encounter for screening mammogram for malignant neoplasm of breast: Secondary | ICD-10-CM

## 2016-02-05 ENCOUNTER — Ambulatory Visit (HOSPITAL_COMMUNITY)
Admission: RE | Admit: 2016-02-05 | Discharge: 2016-02-05 | Disposition: A | Discharge status: Home / Self Care | Payer: Medicare Other | Source: Ambulatory Visit | Attending: Family Medicine | Admitting: Family Medicine

## 2016-02-05 DIAGNOSIS — Z1231 Encounter for screening mammogram for malignant neoplasm of breast: Secondary | ICD-10-CM | POA: Diagnosis present

## 2017-02-09 ENCOUNTER — Other Ambulatory Visit (HOSPITAL_COMMUNITY): Payer: Self-pay | Admitting: Family Medicine

## 2017-02-09 DIAGNOSIS — Z1231 Encounter for screening mammogram for malignant neoplasm of breast: Secondary | ICD-10-CM

## 2017-02-11 ENCOUNTER — Ambulatory Visit (HOSPITAL_COMMUNITY)
Admission: RE | Admit: 2017-02-11 | Discharge: 2017-02-11 | Disposition: A | Discharge status: Home / Self Care | Payer: Medicare Other | Source: Ambulatory Visit | Attending: Family Medicine | Admitting: Family Medicine

## 2017-02-11 DIAGNOSIS — Z1231 Encounter for screening mammogram for malignant neoplasm of breast: Secondary | ICD-10-CM | POA: Diagnosis not present

## 2017-07-19 ENCOUNTER — Encounter (INDEPENDENT_AMBULATORY_CARE_PROVIDER_SITE_OTHER): Payer: Self-pay | Admitting: Internal Medicine

## 2017-07-19 ENCOUNTER — Encounter (INDEPENDENT_AMBULATORY_CARE_PROVIDER_SITE_OTHER): Payer: Self-pay

## 2017-08-02 ENCOUNTER — Encounter (INDEPENDENT_AMBULATORY_CARE_PROVIDER_SITE_OTHER): Payer: Self-pay | Admitting: Internal Medicine

## 2017-08-02 ENCOUNTER — Ambulatory Visit (INDEPENDENT_AMBULATORY_CARE_PROVIDER_SITE_OTHER): Payer: Medicare Other | Admitting: Internal Medicine

## 2017-08-02 VITALS — BP 146/70 | HR 72 | Temp 97.8°F | Ht 67.5 in | Wt 208.1 lb

## 2017-08-02 DIAGNOSIS — K5900 Constipation, unspecified: Secondary | ICD-10-CM | POA: Diagnosis not present

## 2017-08-02 MED ORDER — LUBIPROSTONE 8 MCG PO CAPS: 8.0000 ug | ORAL_CAPSULE | Freq: Two times a day (BID) | ORAL | 2 refills | Status: DC

## 2017-08-02 NOTE — Progress Notes (Addendum)
Subjective:    Patient ID: Penny [name], female    DOB: [birth year], 77 y.o.   MRN: [id number]  HPI Referred by Dr. Dimas Aguas for constipation. Constipation for over a year.  She was told to take Gas relief and eat peaches which did not help. She says her blood sugar went up with the peaches. She has tried Miralax, Mylanta, MOM which did not help. She has a BM daily and sometimes she will have 3 BMs. She says sometimes she does not feel completely empty. Her stools are sometimes like goat balls.  Her stools are not really hard.  Her appetite is good. No weight loss. She is trying to lose weight weight. She is trying to walk. She thinks her brother had colon cancer.   Her last colonoscopy was in January of 2015 and was normal.     Hx of RA. CABG.   Review of Systems Past Medical History:  Diagnosis Date  . Anemia   . CAD (coronary artery disease)   . HTN (hypertension)    white coat  . Rheumatoid arthritis(714.0)     Past Surgical History:  Procedure Laterality Date  . CORONARY ARTERY BYPASS GRAFT  2007   bypass graft x 3, preserved left ventricular function    Allergies  Allergen Reactions  . Amlodipine Besylate     REACTION: swelling  . Penicillins     Current Outpatient Medications on File Prior to Visit  Medication Sig Dispense Refill  . acetaminophen (TYLENOL) 650 MG CR tablet Take 500 mg by mouth every 8 (eight) hours as needed for pain.    Marland Kitchen aluminum hydroxide-magnesium carbonate (GAVISCON) 95-358 MG/15ML SUSP Take by mouth.    Marland Kitchen aspirin 81 MG tablet Take 81 mg by mouth daily.    Marland Kitchen atenolol (TENORMIN) 25 MG tablet Take 25 mg by mouth 2 (two) times daily.    . cholecalciferol (VITAMIN D) 400 units TABS tablet Take 1,000 Units by mouth.    . diphenhydrAMINE (BENADRYL) 25 mg capsule Take 25 mg by mouth every 6 (six) hours as needed.    . Flaxseed, Linseed, (FLAX SEED OIL PO) Take by mouth daily.    . folic acid (FOLVITE) 1 MG tablet Take 1 mg by mouth daily.    .  magnesium hydroxide (MILK OF MAGNESIA) 800 MG/5ML suspension Take by mouth daily as needed for constipation.    . methotrexate (RHEUMATREX) 2.5 MG tablet Take 2.5 mg by mouth once a week. Caution:Chemotherapy. Protect from light. Take 8 tablets weekly on Wednesday    . Multiple Vitamin (MULTIVITAMIN) capsule Take 1 capsule by mouth daily.    . simethicone (MYLICON) 125 MG chewable tablet Chew 125 mg by mouth every 6 (six) hours as needed for flatulence.    . triamcinolone cream (KENALOG) 0.1 % Apply 1 application topically as needed.    . vitamin B-12 (CYANOCOBALAMIN) 500 MCG tablet Take 500 mcg by mouth daily.    . vitamin E 400 UNIT capsule Take 400 Units by mouth daily.     No current facility-administered medications on file prior to visit.         Objective:   Physical Exam Blood pressure (!) 146/70, pulse 72, temperature 97.8 F (36.6 C), height 5' 7.5" (1.715 m), weight 208 lb 1.6 oz (94.4 kg). Alert and oriented. Skin warm and dry. Oral mucosa is moist.   . Sclera anicteric, conjunctivae is pink. Thyroid not enlarged. No cervical lymphadenopathy. Lungs clear. Heart regular rate and rhythm.  Abdomen is soft. Bowel sounds are positive. No hepatomegaly. No abdominal masses felt. No tenderness.  No edema to lower extremities.          Assessment & Plan:  Constipation. Am going to try her on Amitiza 8cmcg BID. PR in 2 weeks.

## 2017-08-02 NOTE — Patient Instructions (Addendum)
Rx for Amitiza BID to her pharmacy. Will give samples.

## 2017-08-19 ENCOUNTER — Telehealth (INDEPENDENT_AMBULATORY_CARE_PROVIDER_SITE_OTHER): Payer: Self-pay | Admitting: *Deleted

## 2017-08-19 NOTE — Telephone Encounter (Signed)
Patient called stating as far as the medication it seems to be doing good and the only problem that she has it seems like her "rear end" feels irratied. Patient is unsure if it is the medication or if it is her hemorrhoids, patient states when she takes the medication in the evening that is when she seems to have the problem with her "rear end" Please advise  [phone]

## 2017-08-20 NOTE — Telephone Encounter (Signed)
Message left on answering machine 

## 2017-08-23 ENCOUNTER — Telehealth (INDEPENDENT_AMBULATORY_CARE_PROVIDER_SITE_OTHER): Payer: Self-pay | Admitting: *Deleted

## 2017-08-23 NOTE — Telephone Encounter (Signed)
Patient returned a call to Camelia Eng [phone]

## 2017-08-23 NOTE — Telephone Encounter (Signed)
No answer at home #

## 2017-08-24 NOTE — Telephone Encounter (Signed)
She will continue the Amitiza

## 2017-11-01 ENCOUNTER — Encounter (INDEPENDENT_AMBULATORY_CARE_PROVIDER_SITE_OTHER): Payer: Self-pay | Admitting: Internal Medicine

## 2017-11-01 ENCOUNTER — Ambulatory Visit (INDEPENDENT_AMBULATORY_CARE_PROVIDER_SITE_OTHER): Payer: Medicare Other | Admitting: Internal Medicine

## 2017-11-01 VITALS — BP 160/100 | HR 72 | Temp 98.1°F | Ht 67.5 in | Wt 209.7 lb

## 2017-11-01 DIAGNOSIS — K5909 Other constipation: Secondary | ICD-10-CM

## 2017-11-01 NOTE — Progress Notes (Addendum)
Subjective:    Patient ID: Penny [name], female    DOB: 1941-03-14, 77 y.o.   MRN: [id number]  HPI Here today for f/u.  Last seen in January of this year for constipation. Has tried Miralax in the past. Sometimes she did not feel completely empty.  She tells me she tried the Amitiza, she developed some diarrhea. She says she had some swelling in her ankles. She stopped the Amitiza.  She says she will drink water and have a BM. She says it feels like she still has some stool in her rectum. Once she has the last BM, it starts again the next day. Her appetite is good. No weight loss.     Her last colonoscopy was in January of 2015 and was normal by Dr. Gabriel Cirri.   Family hx of colon cancer in 3 brothers    Review of Systems Past Medical History:  Diagnosis Date  . Anemia   . CAD (coronary artery disease)   . HTN (hypertension)    white coat  . Rheumatoid arthritis(714.0)     Past Surgical History:  Procedure Laterality Date  . CORONARY ARTERY BYPASS GRAFT  2007   bypass graft x 3, preserved left ventricular function  . PARTIAL HYSTERECTOMY      Allergies  Allergen Reactions  . Amlodipine Besylate     REACTION: swelling  . Penicillins     Current Outpatient Medications on File Prior to Visit  Medication Sig Dispense Refill  . acetaminophen (TYLENOL) 650 MG CR tablet Take 500 mg by mouth every 8 (eight) hours as needed for pain.    Marland Kitchen aluminum hydroxide-magnesium carbonate (GAVISCON) 95-358 MG/15ML SUSP Take by mouth.    Marland Kitchen aspirin 81 MG tablet Take 81 mg by mouth daily.    Marland Kitchen atenolol (TENORMIN) 25 MG tablet Take 25 mg by mouth 2 (two) times daily.    . cholecalciferol (VITAMIN D) 400 units TABS tablet Take 1,000 Units by mouth.    . diphenhydrAMINE (BENADRYL) 25 mg capsule Take 25 mg by mouth every 6 (six) hours as needed.    . Flaxseed, Linseed, (FLAX SEED OIL PO) Take by mouth daily.    . magnesium hydroxide (MILK OF MAGNESIA) 800 MG/5ML suspension Take by mouth  daily as needed for constipation.    . methotrexate (RHEUMATREX) 2.5 MG tablet Take 2.5 mg by mouth once a week. Caution:Chemotherapy. Protect from light. Take 8 tablets weekly on Wednesday    . Multiple Vitamin (MULTIVITAMIN) capsule Take 1 capsule by mouth daily.    . simethicone (MYLICON) 125 MG chewable tablet Chew 125 mg by mouth every 6 (six) hours as needed for flatulence.    . triamcinolone cream (KENALOG) 0.1 % Apply 1 application topically as needed.    . vitamin B-12 (CYANOCOBALAMIN) 500 MCG tablet Take 500 mcg by mouth daily.    . vitamin E 400 UNIT capsule Take 400 Units by mouth daily.    . folic acid (FOLVITE) 1 MG tablet Take 1 mg by mouth daily.    Marland Kitchen lubiprostone (AMITIZA) 8 MCG capsule Take 1 capsule (8 mcg total) by mouth 2 (two) times daily with a meal. (Patient not taking: Reported on 11/01/2017) 60 capsule 2   No current facility-administered medications on file prior to visit.         Objective:   Physical Exam Blood pressure (!) 160/100, pulse 72, temperature 98.1 F (36.7 C), height 5' 7.5" (1.715 m), weight 209 lb 11.2 oz (95.1 kg). Alert  and oriented. Skin warm and dry. Oral mucosa is moist.   . Sclera anicteric, conjunctivae is pink. Thyroid not enlarged. No cervical lymphadenopathy. Lungs clear. Heart regular rate and rhythm.  Abdomen is soft. Bowel sounds are positive. No hepatomegaly. No abdominal masses felt. No tenderness.  No edema to lower extremities.           Assessment & Plan:  Constipation. Incomplete emptying.  Am going to start on on low dose LInzess.' Will give her samples.

## 2017-11-01 NOTE — Patient Instructions (Addendum)
Samples of Linzess given to patient.  PR in 2 weeks.

## 2017-12-13 ENCOUNTER — Telehealth (INDEPENDENT_AMBULATORY_CARE_PROVIDER_SITE_OTHER): Payer: Self-pay | Admitting: Internal Medicine

## 2017-12-13 NOTE — Telephone Encounter (Signed)
Noted.  That is okay.

## 2017-12-13 NOTE — Telephone Encounter (Signed)
Patient called stated she decided not to take the samples you gave her because of the side effects - stated she is feeling a little better - just called to give you a progress report

## 2018-01-17 ENCOUNTER — Other Ambulatory Visit (HOSPITAL_COMMUNITY): Payer: Self-pay | Admitting: Family Medicine

## 2018-01-17 ENCOUNTER — Telehealth (INDEPENDENT_AMBULATORY_CARE_PROVIDER_SITE_OTHER): Payer: Self-pay | Admitting: Internal Medicine

## 2018-01-17 DIAGNOSIS — K5900 Constipation, unspecified: Secondary | ICD-10-CM

## 2018-01-17 DIAGNOSIS — Z1231 Encounter for screening mammogram for malignant neoplasm of breast: Secondary | ICD-10-CM

## 2018-01-17 NOTE — Telephone Encounter (Signed)
C/o feeling of incomplete BM. Will get a KUB

## 2018-01-19 ENCOUNTER — Telehealth (INDEPENDENT_AMBULATORY_CARE_PROVIDER_SITE_OTHER): Payer: Self-pay | Admitting: Internal Medicine

## 2018-01-19 ENCOUNTER — Ambulatory Visit (HOSPITAL_COMMUNITY)
Admission: RE | Admit: 2018-01-19 | Discharge: 2018-01-19 | Disposition: A | Discharge status: Home / Self Care | Payer: Medicare Other | Source: Ambulatory Visit | Attending: Internal Medicine | Admitting: Internal Medicine

## 2018-01-19 ENCOUNTER — Other Ambulatory Visit (INDEPENDENT_AMBULATORY_CARE_PROVIDER_SITE_OTHER): Payer: Self-pay | Admitting: Internal Medicine

## 2018-01-19 DIAGNOSIS — K5900 Constipation, unspecified: Secondary | ICD-10-CM | POA: Insufficient documentation

## 2018-01-19 DIAGNOSIS — Z800 Family history of malignant neoplasm of digestive organs: Secondary | ICD-10-CM

## 2018-01-19 DIAGNOSIS — R195 Other fecal abnormalities: Secondary | ICD-10-CM

## 2018-01-19 NOTE — Telephone Encounter (Signed)
I did an adedendum on this nice patient chart. She told me today she has 3 brothers that have had colon cancer. Will add a colonoscopy

## 2018-01-19 NOTE — Telephone Encounter (Signed)
rr

## 2018-01-20 ENCOUNTER — Telehealth (INDEPENDENT_AMBULATORY_CARE_PROVIDER_SITE_OTHER): Payer: Self-pay | Admitting: *Deleted

## 2018-01-20 ENCOUNTER — Encounter (INDEPENDENT_AMBULATORY_CARE_PROVIDER_SITE_OTHER): Payer: Self-pay | Admitting: *Deleted

## 2018-01-20 DIAGNOSIS — R195 Other fecal abnormalities: Secondary | ICD-10-CM | POA: Insufficient documentation

## 2018-01-20 DIAGNOSIS — Z800 Family history of malignant neoplasm of digestive organs: Secondary | ICD-10-CM | POA: Insufficient documentation

## 2018-01-20 NOTE — Telephone Encounter (Signed)
Patient needs trilyte 

## 2018-01-25 MED ORDER — PEG 3350-KCL-NA BICARB-NACL 420 G PO SOLR: 4000.0000 mL | Freq: Once | ORAL | 0 refills | Status: AC

## 2018-02-07 ENCOUNTER — Encounter (HOSPITAL_COMMUNITY): Payer: Self-pay

## 2018-02-07 ENCOUNTER — Ambulatory Visit (HOSPITAL_COMMUNITY)
Admission: RE | Admit: 2018-02-07 | Discharge: 2018-02-07 | Disposition: A | Discharge status: Home / Self Care | Payer: Medicare Other | Source: Ambulatory Visit | Attending: Internal Medicine | Admitting: Internal Medicine

## 2018-02-07 ENCOUNTER — Other Ambulatory Visit: Payer: Self-pay

## 2018-02-07 ENCOUNTER — Encounter (HOSPITAL_COMMUNITY)
Admission: RE | Disposition: A | Discharge status: Home / Self Care | Payer: Self-pay | Source: Ambulatory Visit | Attending: Internal Medicine

## 2018-02-07 DIAGNOSIS — Z7982 Long term (current) use of aspirin: Secondary | ICD-10-CM | POA: Insufficient documentation

## 2018-02-07 DIAGNOSIS — I10 Essential (primary) hypertension: Secondary | ICD-10-CM | POA: Diagnosis not present

## 2018-02-07 DIAGNOSIS — Z951 Presence of aortocoronary bypass graft: Secondary | ICD-10-CM | POA: Diagnosis not present

## 2018-02-07 DIAGNOSIS — Z79899 Other long term (current) drug therapy: Secondary | ICD-10-CM | POA: Diagnosis not present

## 2018-02-07 DIAGNOSIS — K5730 Diverticulosis of large intestine without perforation or abscess without bleeding: Secondary | ICD-10-CM | POA: Diagnosis not present

## 2018-02-07 DIAGNOSIS — K6289 Other specified diseases of anus and rectum: Secondary | ICD-10-CM | POA: Diagnosis not present

## 2018-02-07 DIAGNOSIS — Z800 Family history of malignant neoplasm of digestive organs: Secondary | ICD-10-CM | POA: Diagnosis not present

## 2018-02-07 DIAGNOSIS — Z803 Family history of malignant neoplasm of breast: Secondary | ICD-10-CM | POA: Diagnosis not present

## 2018-02-07 DIAGNOSIS — Z8249 Family history of ischemic heart disease and other diseases of the circulatory system: Secondary | ICD-10-CM | POA: Insufficient documentation

## 2018-02-07 DIAGNOSIS — M069 Rheumatoid arthritis, unspecified: Secondary | ICD-10-CM | POA: Insufficient documentation

## 2018-02-07 DIAGNOSIS — Z8041 Family history of malignant neoplasm of ovary: Secondary | ICD-10-CM | POA: Insufficient documentation

## 2018-02-07 DIAGNOSIS — Z880 Allergy status to penicillin: Secondary | ICD-10-CM | POA: Diagnosis not present

## 2018-02-07 DIAGNOSIS — I2510 Atherosclerotic heart disease of native coronary artery without angina pectoris: Secondary | ICD-10-CM | POA: Diagnosis not present

## 2018-02-07 DIAGNOSIS — Z888 Allergy status to other drugs, medicaments and biological substances status: Secondary | ICD-10-CM | POA: Diagnosis not present

## 2018-02-07 DIAGNOSIS — R195 Other fecal abnormalities: Secondary | ICD-10-CM | POA: Insufficient documentation

## 2018-02-07 DIAGNOSIS — R194 Change in bowel habit: Secondary | ICD-10-CM | POA: Diagnosis not present

## 2018-02-07 HISTORY — PX: COLONOSCOPY: SHX5424

## 2018-02-07 SURGERY — COLONOSCOPY
Anesthesia: Moderate Sedation

## 2018-02-07 MED ORDER — MIDAZOLAM HCL 5 MG/5ML IJ SOLN
INTRAMUSCULAR | Status: DC | PRN
  Administered 2018-02-07: 2 mg via INTRAVENOUS
  Administered 2018-02-07: 1 mg via INTRAVENOUS

## 2018-02-07 MED ORDER — STERILE WATER FOR IRRIGATION IR SOLN
Status: DC | PRN
  Administered 2018-02-07: 09:00:00

## 2018-02-07 MED ORDER — MEPERIDINE HCL 50 MG/ML IJ SOLN
INTRAMUSCULAR | Status: AC
  Filled 2018-02-07: qty 1

## 2018-02-07 MED ORDER — MIDAZOLAM HCL 5 MG/5ML IJ SOLN
INTRAMUSCULAR | Status: AC
  Filled 2018-02-07: qty 10

## 2018-02-07 MED ORDER — MEPERIDINE HCL 100 MG/ML IJ SOLN
INTRAMUSCULAR | Status: AC
  Filled 2018-02-07: qty 2

## 2018-02-07 MED ORDER — MEPERIDINE HCL 50 MG/ML IJ SOLN
INTRAMUSCULAR | Status: DC | PRN
  Administered 2018-02-07 (×2): 25 mg via INTRAVENOUS

## 2018-02-07 MED ORDER — SODIUM CHLORIDE 0.9 % IV SOLN
INTRAVENOUS | Status: DC
  Administered 2018-02-07: 08:00:00 via INTRAVENOUS

## 2018-02-07 NOTE — Op Note (Signed)
[facility] Patient Name: Penny [name] Procedure Date: 02/07/2018 8:13 AM MRN: [id number] Date of Birth: 29-Jun-1941 Attending MD: Lionel December , MD CSN: [id number] Age: 77 Admit Type: Outpatient Procedure:                Colonoscopy Indications:              Change in bowel habits Providers:                Lionel December, MD, Judee Clara, RN, Burke Keels, Technician Referring MD:             Selinda Flavin, MD Medicines:                Meperidine 50 mg IV, Midazolam 5 mg IV Complications:            No immediate complications. Estimated Blood Loss:     Estimated blood loss: none. Procedure:                Pre-Anesthesia Assessment:                           - Prior to the procedure, a History and Physical                            was performed, and patient medications and                            allergies were reviewed. The patient's tolerance of                            previous anesthesia was also reviewed. The risks                            and benefits of the procedure and the sedation                            options and risks were discussed with the patient.                            All questions were answered, and informed consent                            was obtained. Prior Anticoagulants: The patient                            last took aspirin 3 days prior to the procedure.                            ASA Grade Assessment: III - A patient with severe                            systemic disease. After reviewing the risks and  benefits, the patient was deemed in satisfactory                            condition to undergo the procedure.                           After obtaining informed consent, the colonoscope                            was passed under direct vision. Throughout the                            procedure, the patient's blood pressure, pulse, and                            oxygen  saturations were monitored continuously. The                            [id number] ([id number]) was introduced through the                            anus and advanced to the the cecum, identified by                            appendiceal orifice and ileocecal valve. The                            colonoscopy was somewhat difficult. The patient                            tolerated the procedure well. The quality of the                            bowel preparation was excellent. The ileocecal                            valve, appendiceal orifice, and rectum were                            photographed. Scope In: 8:38:24 AM Scope Out: 8:59:51 AM Scope Withdrawal Time: 0 hours 8 minutes 6 seconds  Total Procedure Duration: 0 hours 21 minutes 27 seconds  Findings:      The perianal and digital rectal examinations were normal.      Scattered medium-mouthed diverticula were found in the sigmoid colon and       ascending colon.      The exam was otherwise normal throughout the examined colon.      Anal papilla(e) were hypertrophied. Impression:               - Diverticulosis in the sigmoid colon and in the                            ascending colon.                           -  Anal papilla(e) were hypertrophied.                           - No specimens collected. Moderate Sedation:      Moderate (conscious) sedation was administered by the endoscopy nurse       and supervised by the endoscopist. The following parameters were       monitored: oxygen saturation, heart rate, blood pressure, CO2       capnography and response to care. Total physician intraservice time was       29 minutes. Recommendation:           - Patient has a contact number available for                            emergencies. The signs and symptoms of potential                            delayed complications were discussed with the                            patient. Return to normal activities tomorrow.                             Written discharge instructions were provided to the                            patient.                           - High fiber diet today.                           - Continue present medications.                           - Resume aspirin at prior dose today.                           - Linzess 72 mcg po qam.                           - Dulcolax supp one per rectum qd prn                           - Repeat colonoscopy in 5 years for screening                            purposes. Procedure Code(s):        --- Professional ---                           [phone], Colonoscopy, flexible; diagnostic, including                            collection of specimen(s) by brushing or washing,  when performed (separate procedure)                           G0500, Moderate sedation services provided by the                            same physician or other qualified health care                            professional performing a gastrointestinal                            endoscopic service that sedation supports,                            requiring the presence of an independent trained                            observer to assist in the monitoring of the                            patient's level of consciousness and physiological                            status; initial 15 minutes of intra-service time;                            patient age 30 years or older (additional time may                            be reported with [phone], as appropriate)                           [phone], Moderate sedation services provided by the                            same physician or other qualified health care                            professional performing the diagnostic or                            therapeutic service that the sedation supports,                            requiring the presence of an independent trained                            observer to assist in the  monitoring of the                            patient's level of consciousness and physiological  status; each additional 15 minutes intraservice                            time (List separately in addition to code for                            primary service) Diagnosis Code(s):        --- Professional ---                           K62.89, Other specified diseases of anus and rectum                           R19.4, Change in bowel habit                           K57.30, Diverticulosis of large intestine without                            perforation or abscess without bleeding CPT copyright 2017 [employer]. All rights reserved. The codes documented in this report are preliminary and upon coder review may  be revised to meet current compliance requirements. Lionel December, MD Lionel December, MD 02/07/2018 9:13:23 AM This report has been signed electronically. Number of Addenda: 0

## 2018-02-07 NOTE — Discharge Instructions (Signed)
Resume usual medications including aspirin as before. High-fiber diet.  Can use Can try Linzess 72 mcg by mouth every morning.  Can use Dulcolax suppository on as-needed basis. No driving for 24 hours. Please call office with progress report in 2 weeks.   Colonoscopy, Adult, Care After This sheet gives you information about how to care for yourself after your procedure. Your doctor may also give you more specific instructions. If you have problems or questions, call your doctor. Follow these instructions at home: General instructions   For the first 24 hours after the procedure: ? Do not drive or use machinery. ? Do not sign important documents. ? Do not drink alcohol. ? Do your daily activities more slowly than normal. ? Eat foods that are soft and easy to digest. ? Rest often.  Take over-the-counter or prescription medicines only as told by your doctor.  It is up to you to get the results of your procedure. Ask your doctor, or the department performing the procedure, when your results will be ready. To help cramping and bloating:  Try walking around.  Put heat on your belly (abdomen) as told by your doctor. Use a heat source that your doctor recommends, such as a moist heat pack or a heating pad. ? Put a towel between your skin and the heat source. ? Leave the heat on for 20-30 minutes. ? Remove the heat if your skin turns bright red. This is especially important if you cannot feel pain, heat, or cold. You can get burned. Eating and drinking  Drink enough fluid to keep your pee (urine) clear or pale yellow.  Return to your normal diet as told by your doctor. Avoid heavy or fried foods that are hard to digest.  Avoid drinking alcohol for as long as told by your doctor. Contact a doctor if:  You have blood in your poop (stool) 2-3 days after the procedure. Get help right away if:  You have more than a small amount of blood in your poop.  You see large clumps of tissue  (blood clots) in your poop.  Your belly is swollen.  You feel sick to your stomach (nauseous).  You throw up (vomit).  You have a fever.  You have belly pain that gets worse, and medicine does not help your pain. This information is not intended to replace advice given to you by your health care provider. Make sure you discuss any questions you have with your health care provider. Document Released: 07/25/2010 Document Revised: 03/16/2016 Document Reviewed: 03/16/2016 Elsevier Interactive Patient Education  2017 [employer].  Hemorrhoids Hemorrhoids are swollen veins in and around the rectum or anus. There are two types of hemorrhoids:  Internal hemorrhoids. These occur in the veins that are just inside the rectum. They may poke through to the outside and become irritated and painful.  External hemorrhoids. These occur in the veins that are outside of the anus and can be felt as a painful swelling or hard lump near the anus.  Most hemorrhoids do not cause serious problems, and they can be managed with home treatments such as diet and lifestyle changes. If home treatments do not help your symptoms, procedures can be done to shrink or remove the hemorrhoids. What are the causes? This condition is caused by increased pressure in the anal area. This pressure may result from various things, including:  Constipation.  Straining to have a bowel movement.  Diarrhea.  Pregnancy.  Obesity.  Sitting for long periods of  time.  Heavy lifting or other activity that causes you to strain.  Anal sex.  What are the signs or symptoms? Symptoms of this condition include:  Pain.  Anal itching or irritation.  Rectal bleeding.  Leakage of stool (feces).  Anal swelling.  One or more lumps around the anus.  How is this diagnosed? This condition can often be diagnosed through a visual exam. Other exams or tests may also be done, such as:  Examination of the rectal area with a  gloved hand (digital rectal exam).  Examination of the anal canal using a small tube (anoscope).  A blood test, if you have lost a significant amount of blood.  A test to look inside the colon (sigmoidoscopy or colonoscopy).  How is this treated? This condition can usually be treated at home. However, various procedures may be done if dietary changes, lifestyle changes, and other home treatments do not help your symptoms. These procedures can help make the hemorrhoids smaller or remove them completely. Some of these procedures involve surgery, and others do not. Common procedures include:  Rubber band ligation. Rubber bands are placed at the base of the hemorrhoids to cut off the blood supply to them.  Sclerotherapy. Medicine is injected into the hemorrhoids to shrink them.  Infrared coagulation. A type of light energy is used to get rid of the hemorrhoids.  Hemorrhoidectomy surgery. The hemorrhoids are surgically removed, and the veins that supply them are tied off.  Stapled hemorrhoidopexy surgery. A circular stapling device is used to remove the hemorrhoids and use staples to cut off the blood supply to them.  Follow these instructions at home: Eating and drinking  Eat foods that have a lot of fiber in them, such as whole grains, beans, nuts, fruits, and vegetables. Ask your health care provider about taking products that have added fiber (fiber supplements).  Drink enough fluid to keep your urine clear or pale yellow. Managing pain and swelling  Take warm sitz baths for 20 minutes, 3-4 times a day to ease pain and discomfort.  If directed, apply ice to the affected area. Using ice packs between sitz baths may be helpful. ? Put ice in a plastic bag. ? Place a towel between your skin and the bag. ? Leave the ice on for 20 minutes, 2-3 times a day. General instructions  Take over-the-counter and prescription medicines only as told by your health care provider.  Use medicated  creams or suppositories as told.  Exercise regularly.  Go to the bathroom when you have the urge to have a bowel movement. Do not wait.  Avoid straining to have bowel movements.  Keep the anal area dry and clean. Use wet toilet paper or moist towelettes after a bowel movement.  Do not sit on the toilet for long periods of time. This increases blood pooling and pain. Contact a health care provider if:  You have increasing pain and swelling that are not controlled by treatment or medicine.  You have uncontrolled bleeding.  You have difficulty having a bowel movement, or you are unable to have a bowel movement.  You have pain or inflammation outside the area of the hemorrhoids. This information is not intended to replace advice given to you by your health care provider. Make sure you discuss any questions you have with your health care provider. Document Released: 06/19/2000 Document Revised: 11/20/2015 Document Reviewed: 03/06/2015 Elsevier Interactive Patient Education  2018 [employer].  Diverticulosis Diverticulosis is a condition that develops when small  pouches (diverticula) form in the wall of the large intestine (colon). The colon is where water is absorbed and stool is formed. The pouches form when the inside layer of the colon pushes through weak spots in the outer layers of the colon. You may have a few pouches or many of them. What are the causes? The cause of this condition is not known. What increases the risk? The following factors may make you more likely to develop this condition:  Being older than age 17. Your risk for this condition increases with age. Diverticulosis is rare among people younger than age 29. By age 43, many people have it.  Eating a low-fiber diet.  Having frequent constipation.  Being overweight.  Not getting enough exercise.  Smoking.  Taking over-the-counter pain medicines, like aspirin and ibuprofen.  Having a family history of  diverticulosis.  What are the signs or symptoms? In most people, there are no symptoms of this condition. If you do have symptoms, they may include:  Bloating.  Cramps in the abdomen.  Constipation or diarrhea.  Pain in the lower left side of the abdomen.  How is this diagnosed? This condition is most often diagnosed during an exam for other colon problems. Because diverticulosis usually has no symptoms, it often cannot be diagnosed independently. This condition may be diagnosed by:  Using a flexible scope to examine the colon (colonoscopy).  Taking an X-ray of the colon after dye has been put into the colon (barium enema).  Doing a CT scan.  How is this treated? You may not need treatment for this condition if you have never developed an infection related to diverticulosis. If you have had an infection before, treatment may include:  Eating a high-fiber diet. This may include eating more fruits, vegetables, and grains.  Taking a fiber supplement.  Taking a live bacteria supplement (probiotic).  Taking medicine to relax your colon.  Taking antibiotic medicines.  Follow these instructions at home:  Drink 6-8 glasses of water or more each day to prevent constipation.  Try not to strain when you have a bowel movement.  If you have had an infection before: ? Eat more fiber as directed by your health care provider or your diet and nutrition specialist (dietitian). ? Take a fiber supplement or probiotic, if your health care provider approves.  Take over-the-counter and prescription medicines only as told by your health care provider.  If you were prescribed an antibiotic, take it as told by your health care provider. Do not stop taking the antibiotic even if you start to feel better.  Keep all follow-up visits as told by your health care provider. This is important. Contact a health care provider if:  You have pain in your abdomen.  You have bloating.  You have  cramps.  You have not had a bowel movement in 3 days. Get help right away if:  Your pain gets worse.  Your bloating becomes very bad.  You have a fever or chills, and your symptoms suddenly get worse.  You vomit.  You have bowel movements that are bloody or black.  You have bleeding from your rectum. Summary  Diverticulosis is a condition that develops when small pouches (diverticula) form in the wall of the large intestine (colon).  You may have a few pouches or many of them.  This condition is most often diagnosed during an exam for other colon problems.  If you have had an infection related to diverticulosis, treatment may include increasing  the fiber in your diet, taking supplements, or taking medicines. This information is not intended to replace advice given to you by your health care provider. Make sure you discuss any questions you have with your health care provider. Document Released: 03/19/2004 Document Revised: 05/11/2016 Document Reviewed: 05/11/2016 Elsevier Interactive Patient Education  2017 [employer].

## 2018-02-07 NOTE — H&P (Signed)
Penny [name] is an 77 y.o. female.   Chief Complaint: Patient is here for colonoscopy. HPI: Patient is 77 year old Afro-American female who presents with history of change in bowel habits.  She states even though she goes to the bathroom she has sense of incomplete evacuation.  She also complains of lower abdominal pain.  Last colonoscopy was over 4 years ago.  She denies rectal bleeding anorexia or weight loss. She tried Amitiza but it gave her diarrhea and joint swelling.  She was given samples of Linzess but she has not tried them.  She previously tried polyethylene glycol but it did not help. Family history significant for CRC in 2 brothers.  She believes her third brother who is living also had surgery for colon carcinoma.  One brother was diagnosed in his 22s had surgery and died of unrelated causes.  Her second brother was diagnosed 2 months ago and he is also in his 33s. Family history significant for ovarian carcinoma in her mother who died at age 2 and her sister has been treated for breast carcinoma at age 42 and she is fine 3 years later.  Past Medical History:  Diagnosis Date  . Anemia   . CAD (coronary artery disease)   . HTN (hypertension)    white coat  . Rheumatoid arthritis(714.0)     Past Surgical History:  Procedure Laterality Date  . CORONARY ARTERY BYPASS GRAFT  2007   bypass graft x 3, preserved left ventricular function  . PARTIAL HYSTERECTOMY      Family History  Problem Relation Age of Onset  . Cancer Unknown   . Alcohol abuse Unknown   . Hypertension Unknown    Social History:  reports that she has never smoked. She has never used smokeless tobacco. She reports that she drinks alcohol. She reports that she does not use drugs.  Allergies:  Allergies  Allergen Reactions  . Amlodipine Besylate     REACTION: swelling  . Penicillins Swelling and Rash    Has patient had a PCN reaction causing immediate rash, facial/tongue/throat swelling, SOB or  lightheadedness with hypotension:yes Has patient had a PCN reaction causing severe rash involving mucus membranes or skin necrosis:no Has patient had a PCN reaction that required hospitalization: no Has patient had a PCN reaction occurring within the last 10 years: no If all of the above answers are "NO", then may proceed with Cephalosporin use.     Medications Prior to Admission  Medication Sig Dispense Refill  . acetaminophen (TYLENOL) 650 MG CR tablet Take 500 mg by mouth every 8 (eight) hours as needed for pain.    Marland Kitchen aspirin 81 MG tablet Take 81 mg by mouth daily.    Marland Kitchen atenolol (TENORMIN) 25 MG tablet Take 25 mg by mouth 2 (two) times daily.    . Cholecalciferol 1000 units tablet Take 1,000 Units by mouth daily.     . diphenhydrAMINE (BENADRYL) 25 mg capsule Take 25 mg by mouth every 8 (eight) hours as needed for allergies.     . folic acid (FOLVITE) 1 MG tablet Take 1 mg by mouth daily.    . methotrexate (RHEUMATREX) 2.5 MG tablet Take 20 mg by mouth once a week. Caution:Chemotherapy. Protect from light. Take 8 tablets weekly on Wednesday     . Multiple Vitamin (MULTIVITAMIN) capsule Take 1 capsule by mouth daily.    Marland Kitchen triamcinolone cream (KENALOG) 0.1 % Apply 1 application topically daily as needed (irritation).     . vitamin B-12 (  CYANOCOBALAMIN) 500 MCG tablet Take 500 mcg by mouth daily.    . vitamin E 400 UNIT capsule Take 400 Units by mouth daily.      No results found for this or any previous visit (from the past 48 hour(s)). No results found.  ROS  Blood pressure (!) 158/58, pulse 66, temperature 97.7 F (36.5 C), temperature source Oral, resp. rate 16, height 5' 7.5" (1.715 m), weight 205 lb (93 kg), SpO2 100 %. Physical Exam  Constitutional: She appears well-developed and well-nourished.  HENT:  Mouth/Throat: Oropharynx is clear and moist.  Eyes: Conjunctivae are normal. No scleral icterus.  Neck: No thyromegaly present.  Cardiovascular: Normal rate, regular rhythm  and normal heart sounds.  No murmur heard. Respiratory: Effort normal and breath sounds normal.  GI:  Abdomen is symmetrical.  There is scar across lower abdomen.  On palpation is soft and nontender with organomegaly or masses.  Musculoskeletal: She exhibits no edema.  Neurological: She is alert.  Skin: Skin is warm and dry.     Assessment/Plan Change in bowel habits. Family history of CRC in multiple first-degree relatives. Diagnostic colonoscopy.  Lionel December, MD 02/07/2018, 8:24 AM

## 2018-02-11 ENCOUNTER — Encounter (HOSPITAL_COMMUNITY): Payer: Self-pay | Admitting: Internal Medicine

## 2018-02-16 ENCOUNTER — Ambulatory Visit (HOSPITAL_COMMUNITY)
Admission: RE | Admit: 2018-02-16 | Discharge: 2018-02-16 | Disposition: A | Discharge status: Home / Self Care | Payer: Medicare Other | Source: Ambulatory Visit | Attending: Family Medicine | Admitting: Family Medicine

## 2018-02-16 DIAGNOSIS — Z1231 Encounter for screening mammogram for malignant neoplasm of breast: Secondary | ICD-10-CM | POA: Insufficient documentation

## 2019-01-31 ENCOUNTER — Other Ambulatory Visit (HOSPITAL_COMMUNITY): Payer: Self-pay | Admitting: Family Medicine

## 2019-01-31 DIAGNOSIS — Z1231 Encounter for screening mammogram for malignant neoplasm of breast: Secondary | ICD-10-CM

## 2019-02-22 ENCOUNTER — Encounter (HOSPITAL_COMMUNITY): Payer: Self-pay

## 2019-02-22 ENCOUNTER — Other Ambulatory Visit: Payer: Self-pay

## 2019-02-22 ENCOUNTER — Ambulatory Visit (HOSPITAL_COMMUNITY)
Admission: RE | Admit: 2019-02-22 | Discharge: 2019-02-22 | Disposition: A | Discharge status: Home / Self Care | Payer: Medicare Other | Source: Ambulatory Visit | Attending: Family Medicine | Admitting: Family Medicine

## 2019-02-22 DIAGNOSIS — Z1231 Encounter for screening mammogram for malignant neoplasm of breast: Secondary | ICD-10-CM | POA: Insufficient documentation

## 2020-01-16 ENCOUNTER — Other Ambulatory Visit (HOSPITAL_COMMUNITY): Payer: Self-pay | Admitting: Family Medicine

## 2020-01-16 DIAGNOSIS — Z1231 Encounter for screening mammogram for malignant neoplasm of breast: Secondary | ICD-10-CM

## 2020-02-28 ENCOUNTER — Ambulatory Visit (HOSPITAL_COMMUNITY): Payer: Medicare Other

## 2020-02-29 ENCOUNTER — Ambulatory Visit (HOSPITAL_COMMUNITY)
Admission: RE | Admit: 2020-02-29 | Discharge: 2020-02-29 | Disposition: A | Discharge status: Home / Self Care | Payer: Medicare PPO | Source: Ambulatory Visit | Attending: Family Medicine | Admitting: Family Medicine

## 2020-02-29 ENCOUNTER — Other Ambulatory Visit: Payer: Self-pay

## 2020-02-29 DIAGNOSIS — Z1231 Encounter for screening mammogram for malignant neoplasm of breast: Secondary | ICD-10-CM | POA: Insufficient documentation

## 2021-01-09 ENCOUNTER — Other Ambulatory Visit (HOSPITAL_COMMUNITY): Payer: Self-pay | Admitting: Family Medicine

## 2021-01-09 DIAGNOSIS — Z1231 Encounter for screening mammogram for malignant neoplasm of breast: Secondary | ICD-10-CM

## 2021-03-05 ENCOUNTER — Ambulatory Visit (HOSPITAL_COMMUNITY)
Admission: RE | Admit: 2021-03-05 | Discharge: 2021-03-05 | Disposition: A | Discharge status: Home / Self Care | Payer: Medicare PPO | Source: Ambulatory Visit | Attending: Family Medicine | Admitting: Family Medicine

## 2021-03-05 ENCOUNTER — Other Ambulatory Visit: Payer: Self-pay

## 2021-03-05 DIAGNOSIS — Z1231 Encounter for screening mammogram for malignant neoplasm of breast: Secondary | ICD-10-CM | POA: Diagnosis present

## 2022-01-23 ENCOUNTER — Other Ambulatory Visit (HOSPITAL_COMMUNITY): Payer: Self-pay | Admitting: Internal Medicine

## 2022-01-23 DIAGNOSIS — Z1231 Encounter for screening mammogram for malignant neoplasm of breast: Secondary | ICD-10-CM

## 2022-03-11 ENCOUNTER — Ambulatory Visit (HOSPITAL_COMMUNITY): Payer: Medicare PPO

## 2022-03-12 ENCOUNTER — Ambulatory Visit (HOSPITAL_COMMUNITY)
Admission: RE | Admit: 2022-03-12 | Discharge: 2022-03-12 | Disposition: A | Discharge status: Home / Self Care | Payer: Medicare PPO | Source: Ambulatory Visit | Attending: Internal Medicine | Admitting: Internal Medicine

## 2022-03-12 DIAGNOSIS — Z1231 Encounter for screening mammogram for malignant neoplasm of breast: Secondary | ICD-10-CM | POA: Diagnosis not present

## 2022-08-13 IMAGING — MG MM DIGITAL SCREENING BILAT W/ TOMO AND CAD
8 series · 8 of 24 positions shown · non-contrast
Comparison: Previous exam(s).

CLINICAL DATA: Screening.

EXAM:
DIGITAL SCREENING BILATERAL MAMMOGRAM WITH TOMOSYNTHESIS AND CAD
TECHNIQUE: Bilateral screening digital craniocaudal and mediolateral oblique
mammograms were obtained. Bilateral screening digital breast
tomosynthesis was performed. The images were evaluated with
computer-aided detection.

[L MLO synth-2D]
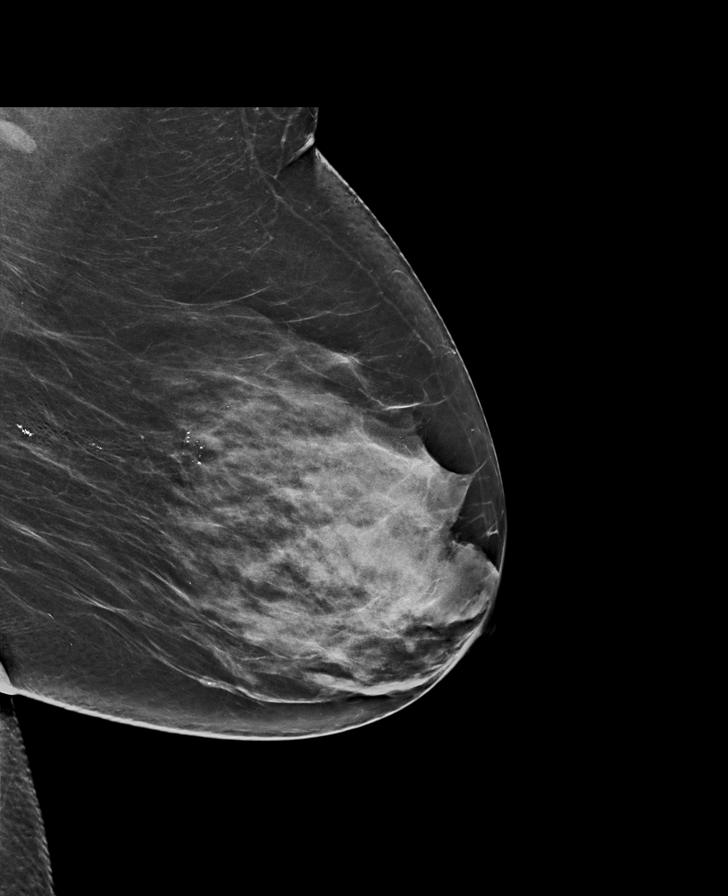

[R CC synth-2D]
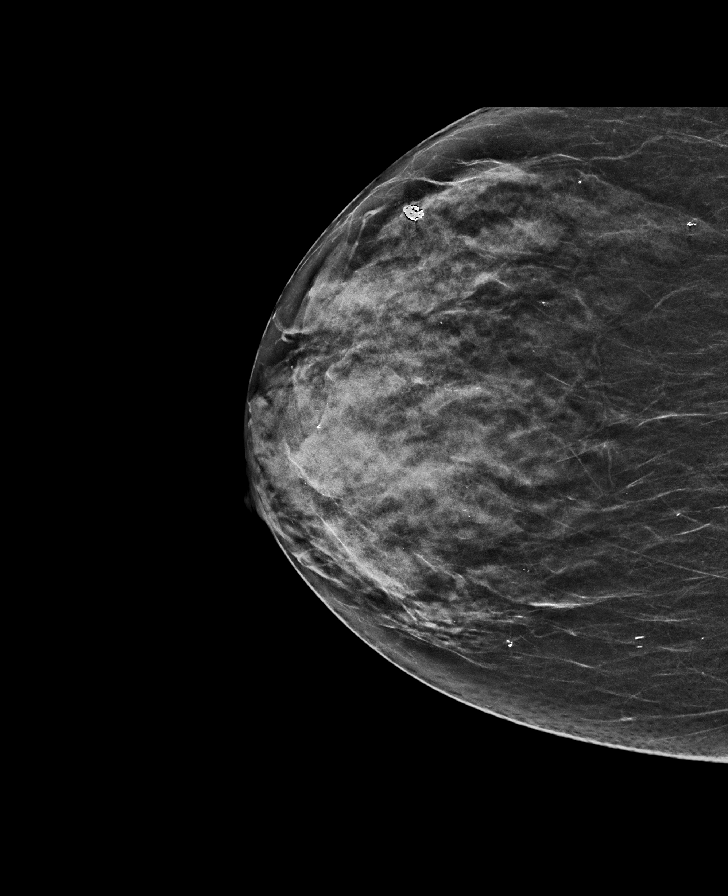

[R MLO synth-2D]
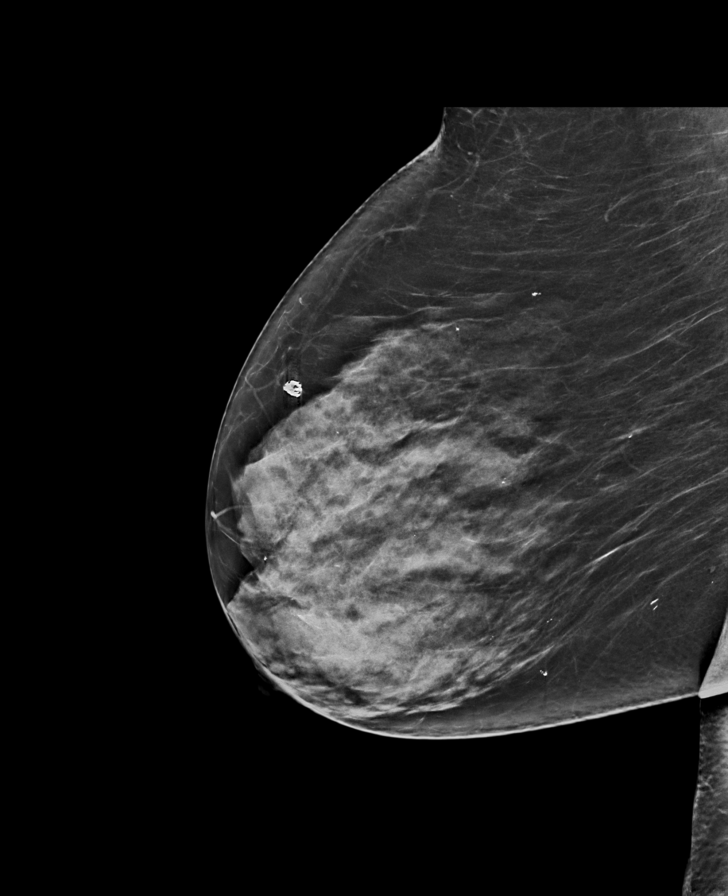

[L CC synth-2D]
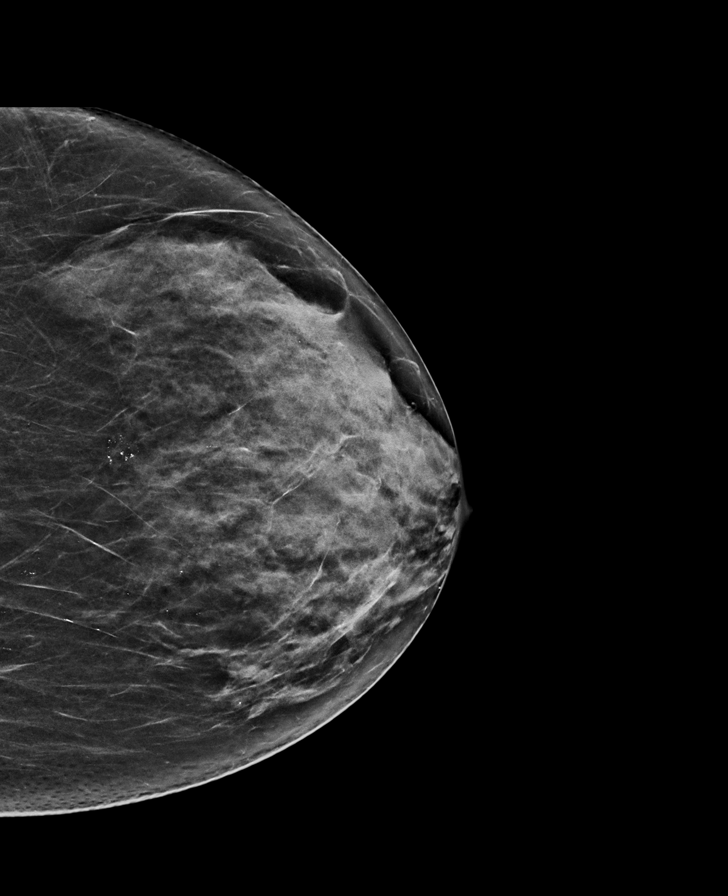

[R MLO tomo · tomo slice 35/70.0]
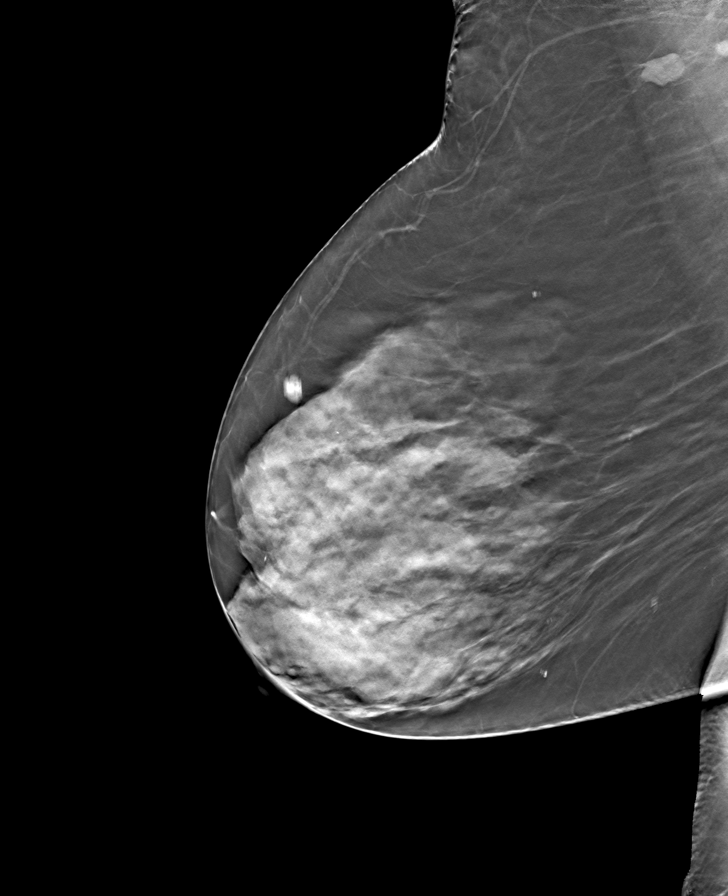

[L MLO tomo · tomo slice 37/73.0]
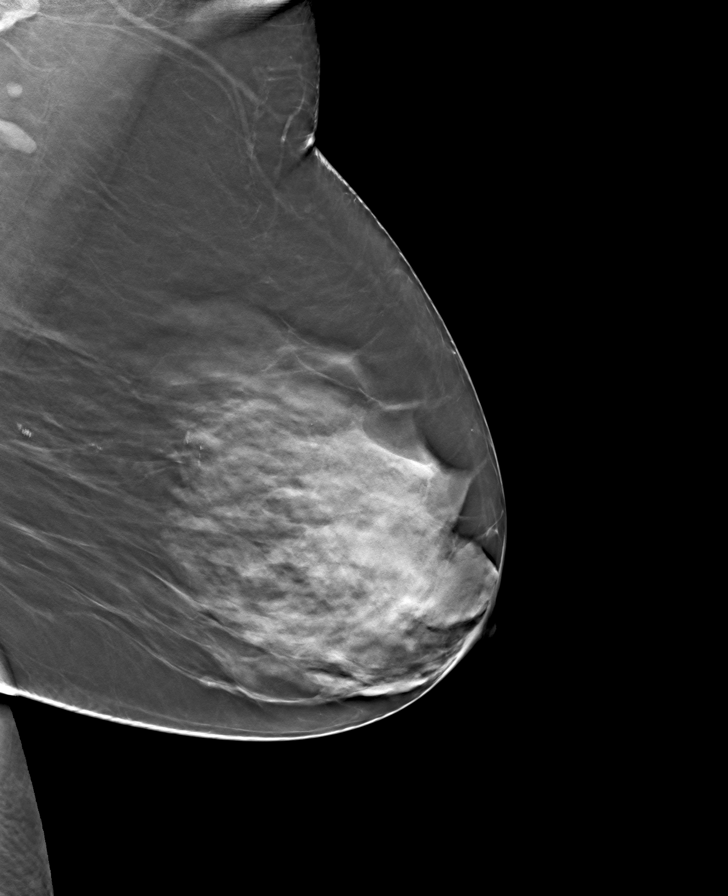

[L CC tomo · tomo slice 31/61.0]
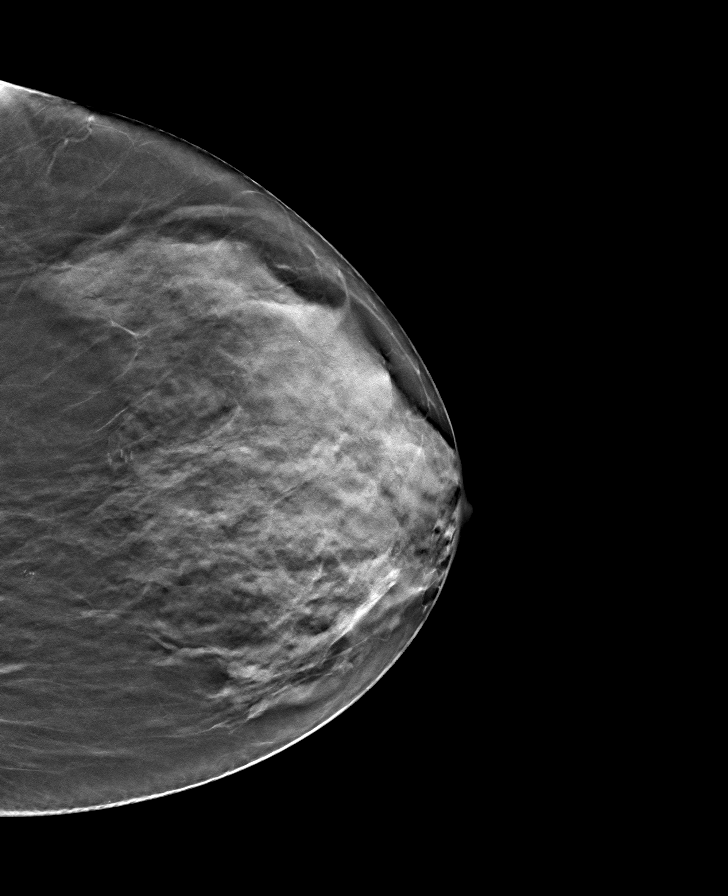

[R CC tomo · tomo slice 30/59.0]
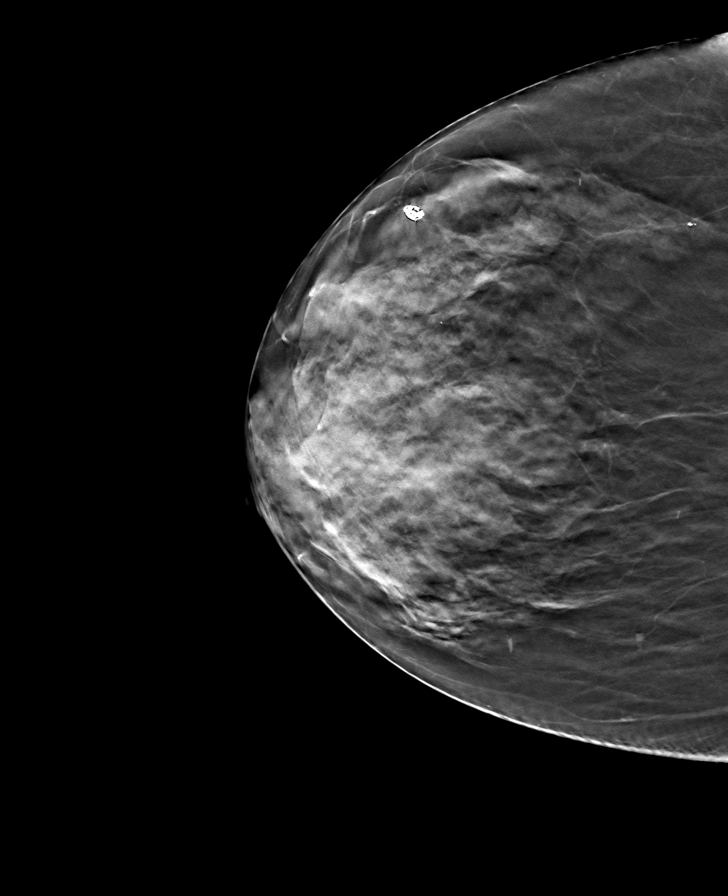

[8 of 24 positions shown; findings below may reference images not displayed]

ACR Breast Density Category d: The breast tissue is extremely dense,
which lowers the sensitivity of mammography
FINDINGS: There are no findings suspicious for malignancy.
IMPRESSION: No mammographic evidence of malignancy. A result letter of this
screening mammogram will be mailed directly to the patient.

RECOMMENDATION:
Screening mammogram in one year. (Code:TA-V-WV9)

BI-RADS CATEGORY  1: Negative.

## 2023-01-14 DIAGNOSIS — M0589 Other rheumatoid arthritis with rheumatoid factor of multiple sites: Secondary | ICD-10-CM | POA: Diagnosis not present

## 2023-01-14 DIAGNOSIS — Z79899 Other long term (current) drug therapy: Secondary | ICD-10-CM | POA: Diagnosis not present

## 2023-01-14 DIAGNOSIS — D696 Thrombocytopenia, unspecified: Secondary | ICD-10-CM | POA: Diagnosis not present

## 2023-01-14 DIAGNOSIS — M8580 Other specified disorders of bone density and structure, unspecified site: Secondary | ICD-10-CM | POA: Diagnosis not present

## 2023-01-14 DIAGNOSIS — M150 Primary generalized (osteo)arthritis: Secondary | ICD-10-CM | POA: Diagnosis not present

## 2023-01-14 DIAGNOSIS — M7989 Other specified soft tissue disorders: Secondary | ICD-10-CM | POA: Diagnosis not present

## 2023-01-14 DIAGNOSIS — M25569 Pain in unspecified knee: Secondary | ICD-10-CM | POA: Diagnosis not present

## 2023-01-14 DIAGNOSIS — I2510 Atherosclerotic heart disease of native coronary artery without angina pectoris: Secondary | ICD-10-CM | POA: Diagnosis not present

## 2023-01-19 DIAGNOSIS — I5032 Chronic diastolic (congestive) heart failure: Secondary | ICD-10-CM | POA: Diagnosis not present

## 2023-01-19 DIAGNOSIS — I10 Essential (primary) hypertension: Secondary | ICD-10-CM | POA: Diagnosis not present

## 2023-01-19 DIAGNOSIS — I4821 Permanent atrial fibrillation: Secondary | ICD-10-CM | POA: Diagnosis not present

## 2023-01-19 DIAGNOSIS — I361 Nonrheumatic tricuspid (valve) insufficiency: Secondary | ICD-10-CM | POA: Diagnosis not present

## 2023-01-19 DIAGNOSIS — I340 Nonrheumatic mitral (valve) insufficiency: Secondary | ICD-10-CM | POA: Diagnosis not present

## 2023-01-19 DIAGNOSIS — I2510 Atherosclerotic heart disease of native coronary artery without angina pectoris: Secondary | ICD-10-CM | POA: Diagnosis not present

## 2023-01-29 ENCOUNTER — Encounter (INDEPENDENT_AMBULATORY_CARE_PROVIDER_SITE_OTHER): Payer: Self-pay | Admitting: *Deleted

## 2023-02-03 DIAGNOSIS — I340 Nonrheumatic mitral (valve) insufficiency: Secondary | ICD-10-CM | POA: Diagnosis not present

## 2023-02-03 DIAGNOSIS — I5032 Chronic diastolic (congestive) heart failure: Secondary | ICD-10-CM | POA: Diagnosis not present

## 2023-02-03 DIAGNOSIS — I361 Nonrheumatic tricuspid (valve) insufficiency: Secondary | ICD-10-CM | POA: Diagnosis not present

## 2023-02-03 DIAGNOSIS — I4821 Permanent atrial fibrillation: Secondary | ICD-10-CM | POA: Diagnosis not present

## 2023-03-09 DIAGNOSIS — R739 Hyperglycemia, unspecified: Secondary | ICD-10-CM | POA: Diagnosis not present

## 2023-03-09 DIAGNOSIS — I10 Essential (primary) hypertension: Secondary | ICD-10-CM | POA: Diagnosis not present

## 2023-03-09 DIAGNOSIS — E7801 Familial hypercholesterolemia: Secondary | ICD-10-CM | POA: Diagnosis not present

## 2023-03-09 DIAGNOSIS — I509 Heart failure, unspecified: Secondary | ICD-10-CM | POA: Diagnosis not present

## 2023-03-09 DIAGNOSIS — R5383 Other fatigue: Secondary | ICD-10-CM | POA: Diagnosis not present

## 2023-03-17 LAB — AMB RESULTS CONSOLE CBG: Glucose: 97

## 2023-03-17 NOTE — Progress Notes (Signed)
Declined SDOH.

## 2023-04-05 DIAGNOSIS — S92502A Displaced unspecified fracture of left lesser toe(s), initial encounter for closed fracture: Secondary | ICD-10-CM | POA: Diagnosis not present

## 2023-04-05 DIAGNOSIS — S9032XA Contusion of left foot, initial encounter: Secondary | ICD-10-CM | POA: Diagnosis not present

## 2023-04-06 ENCOUNTER — Encounter: Payer: Self-pay | Admitting: *Deleted

## 2023-04-06 NOTE — Progress Notes (Signed)
Pt attended 03/17/2023 screening event where her b/p was 146/85 and her blood sugar was 97. At the event the pt did not identify and SDOH insecurities. Chart review indicates pt has been seeing Dr. Mitzi Hansen at [facility] as her PCP. Per phone call to PCP office today, pt was just seen by her PCP on 03/09/23. PCP office does not document office visit notes in [facility] so unable to see visit info or future visits. Chart review also indicates that pt has ongoing cardiology support at [facility] cardi[facility], where her lst visit was on 01/19/23 and her b/p ws 122/78. No additional health equity team support indicated at this time.

## 2023-04-16 ENCOUNTER — Other Ambulatory Visit (HOSPITAL_COMMUNITY): Payer: Self-pay | Admitting: Internal Medicine

## 2023-04-16 DIAGNOSIS — Z1231 Encounter for screening mammogram for malignant neoplasm of breast: Secondary | ICD-10-CM

## 2023-04-26 DIAGNOSIS — S92502D Displaced unspecified fracture of left lesser toe(s), subsequent encounter for fracture with routine healing: Secondary | ICD-10-CM | POA: Diagnosis not present

## 2023-04-27 DIAGNOSIS — Z79899 Other long term (current) drug therapy: Secondary | ICD-10-CM | POA: Diagnosis not present

## 2023-04-27 DIAGNOSIS — I2510 Atherosclerotic heart disease of native coronary artery without angina pectoris: Secondary | ICD-10-CM | POA: Diagnosis not present

## 2023-04-27 DIAGNOSIS — D696 Thrombocytopenia, unspecified: Secondary | ICD-10-CM | POA: Diagnosis not present

## 2023-04-27 DIAGNOSIS — M25569 Pain in unspecified knee: Secondary | ICD-10-CM | POA: Diagnosis not present

## 2023-04-27 DIAGNOSIS — M150 Primary generalized (osteo)arthritis: Secondary | ICD-10-CM | POA: Diagnosis not present

## 2023-04-27 DIAGNOSIS — M8580 Other specified disorders of bone density and structure, unspecified site: Secondary | ICD-10-CM | POA: Diagnosis not present

## 2023-04-27 DIAGNOSIS — M0589 Other rheumatoid arthritis with rheumatoid factor of multiple sites: Secondary | ICD-10-CM | POA: Diagnosis not present

## 2023-04-27 DIAGNOSIS — M7989 Other specified soft tissue disorders: Secondary | ICD-10-CM | POA: Diagnosis not present

## 2023-05-10 ENCOUNTER — Encounter (HOSPITAL_COMMUNITY): Payer: Self-pay

## 2023-05-10 ENCOUNTER — Ambulatory Visit (HOSPITAL_COMMUNITY)
Admission: RE | Admit: 2023-05-10 | Discharge: 2023-05-10 | Disposition: A | Discharge status: Home / Self Care | Payer: Medicare PPO | Source: Ambulatory Visit | Attending: Internal Medicine | Admitting: Internal Medicine

## 2023-05-10 DIAGNOSIS — Z1231 Encounter for screening mammogram for malignant neoplasm of breast: Secondary | ICD-10-CM | POA: Insufficient documentation

## 2023-05-24 DIAGNOSIS — S92502D Displaced unspecified fracture of left lesser toe(s), subsequent encounter for fracture with routine healing: Secondary | ICD-10-CM | POA: Diagnosis not present

## 2023-07-27 DIAGNOSIS — D696 Thrombocytopenia, unspecified: Secondary | ICD-10-CM | POA: Diagnosis not present

## 2023-07-27 DIAGNOSIS — M0589 Other rheumatoid arthritis with rheumatoid factor of multiple sites: Secondary | ICD-10-CM | POA: Diagnosis not present

## 2023-07-27 DIAGNOSIS — M7989 Other specified soft tissue disorders: Secondary | ICD-10-CM | POA: Diagnosis not present

## 2023-07-27 DIAGNOSIS — M25569 Pain in unspecified knee: Secondary | ICD-10-CM | POA: Diagnosis not present

## 2023-07-27 DIAGNOSIS — Z79899 Other long term (current) drug therapy: Secondary | ICD-10-CM | POA: Diagnosis not present

## 2023-07-27 DIAGNOSIS — M8580 Other specified disorders of bone density and structure, unspecified site: Secondary | ICD-10-CM | POA: Diagnosis not present

## 2023-07-27 DIAGNOSIS — I2510 Atherosclerotic heart disease of native coronary artery without angina pectoris: Secondary | ICD-10-CM | POA: Diagnosis not present

## 2023-07-27 DIAGNOSIS — M150 Primary generalized (osteo)arthritis: Secondary | ICD-10-CM | POA: Diagnosis not present

## 2023-07-28 DIAGNOSIS — I4821 Permanent atrial fibrillation: Secondary | ICD-10-CM | POA: Diagnosis not present

## 2023-07-28 DIAGNOSIS — I2510 Atherosclerotic heart disease of native coronary artery without angina pectoris: Secondary | ICD-10-CM | POA: Diagnosis not present

## 2023-07-28 DIAGNOSIS — I361 Nonrheumatic tricuspid (valve) insufficiency: Secondary | ICD-10-CM | POA: Diagnosis not present

## 2023-07-28 DIAGNOSIS — I10 Essential (primary) hypertension: Secondary | ICD-10-CM | POA: Diagnosis not present

## 2023-07-28 DIAGNOSIS — I340 Nonrheumatic mitral (valve) insufficiency: Secondary | ICD-10-CM | POA: Diagnosis not present

## 2023-07-28 DIAGNOSIS — I5032 Chronic diastolic (congestive) heart failure: Secondary | ICD-10-CM | POA: Diagnosis not present

## 2023-09-07 DIAGNOSIS — Z0001 Encounter for general adult medical examination with abnormal findings: Secondary | ICD-10-CM | POA: Diagnosis not present

## 2023-09-07 DIAGNOSIS — E7801 Familial hypercholesterolemia: Secondary | ICD-10-CM | POA: Diagnosis not present

## 2023-09-07 DIAGNOSIS — R5383 Other fatigue: Secondary | ICD-10-CM | POA: Diagnosis not present

## 2023-09-07 DIAGNOSIS — Z131 Encounter for screening for diabetes mellitus: Secondary | ICD-10-CM | POA: Diagnosis not present

## 2023-09-07 DIAGNOSIS — E7800 Pure hypercholesterolemia, unspecified: Secondary | ICD-10-CM | POA: Diagnosis not present

## 2023-09-07 DIAGNOSIS — Z1329 Encounter for screening for other suspected endocrine disorder: Secondary | ICD-10-CM | POA: Diagnosis not present

## 2023-09-07 DIAGNOSIS — R300 Dysuria: Secondary | ICD-10-CM | POA: Diagnosis not present

## 2023-09-07 DIAGNOSIS — I509 Heart failure, unspecified: Secondary | ICD-10-CM | POA: Diagnosis not present

## 2023-09-07 DIAGNOSIS — Z1321 Encounter for screening for nutritional disorder: Secondary | ICD-10-CM | POA: Diagnosis not present

## 2023-09-14 DIAGNOSIS — Z0001 Encounter for general adult medical examination with abnormal findings: Secondary | ICD-10-CM | POA: Diagnosis not present

## 2023-09-14 DIAGNOSIS — M069 Rheumatoid arthritis, unspecified: Secondary | ICD-10-CM | POA: Diagnosis not present

## 2023-09-14 DIAGNOSIS — I10 Essential (primary) hypertension: Secondary | ICD-10-CM | POA: Diagnosis not present

## 2023-09-14 DIAGNOSIS — E559 Vitamin D deficiency, unspecified: Secondary | ICD-10-CM | POA: Diagnosis not present

## 2023-09-14 DIAGNOSIS — Z6832 Body mass index (BMI) 32.0-32.9, adult: Secondary | ICD-10-CM | POA: Diagnosis not present

## 2023-09-14 DIAGNOSIS — E7800 Pure hypercholesterolemia, unspecified: Secondary | ICD-10-CM | POA: Diagnosis not present

## 2023-09-21 DIAGNOSIS — E876 Hypokalemia: Secondary | ICD-10-CM | POA: Diagnosis not present

## 2023-09-28 DIAGNOSIS — E7800 Pure hypercholesterolemia, unspecified: Secondary | ICD-10-CM | POA: Diagnosis not present

## 2023-09-28 DIAGNOSIS — M069 Rheumatoid arthritis, unspecified: Secondary | ICD-10-CM | POA: Diagnosis not present

## 2023-09-28 DIAGNOSIS — I10 Essential (primary) hypertension: Secondary | ICD-10-CM | POA: Diagnosis not present

## 2023-09-28 DIAGNOSIS — Z6831 Body mass index (BMI) 31.0-31.9, adult: Secondary | ICD-10-CM | POA: Diagnosis not present

## 2023-09-28 DIAGNOSIS — E559 Vitamin D deficiency, unspecified: Secondary | ICD-10-CM | POA: Diagnosis not present

## 2023-10-11 DIAGNOSIS — I509 Heart failure, unspecified: Secondary | ICD-10-CM | POA: Diagnosis not present

## 2023-10-11 DIAGNOSIS — I10 Essential (primary) hypertension: Secondary | ICD-10-CM | POA: Diagnosis not present

## 2023-10-11 DIAGNOSIS — R739 Hyperglycemia, unspecified: Secondary | ICD-10-CM | POA: Diagnosis not present

## 2023-10-30 LAB — AMB RESULTS CONSOLE CBG: Glucose: 108

## 2023-11-02 DIAGNOSIS — Z79899 Other long term (current) drug therapy: Secondary | ICD-10-CM | POA: Diagnosis not present

## 2023-11-02 DIAGNOSIS — D696 Thrombocytopenia, unspecified: Secondary | ICD-10-CM | POA: Diagnosis not present

## 2023-11-02 DIAGNOSIS — M0589 Other rheumatoid arthritis with rheumatoid factor of multiple sites: Secondary | ICD-10-CM | POA: Diagnosis not present

## 2023-11-02 DIAGNOSIS — M150 Primary generalized (osteo)arthritis: Secondary | ICD-10-CM | POA: Diagnosis not present

## 2023-11-02 DIAGNOSIS — I2510 Atherosclerotic heart disease of native coronary artery without angina pectoris: Secondary | ICD-10-CM | POA: Diagnosis not present

## 2023-11-02 DIAGNOSIS — M25569 Pain in unspecified knee: Secondary | ICD-10-CM | POA: Diagnosis not present

## 2023-11-02 DIAGNOSIS — M8580 Other specified disorders of bone density and structure, unspecified site: Secondary | ICD-10-CM | POA: Diagnosis not present

## 2023-11-02 DIAGNOSIS — M7989 Other specified soft tissue disorders: Secondary | ICD-10-CM | POA: Diagnosis not present

## 2023-11-02 DIAGNOSIS — M8589 Other specified disorders of bone density and structure, multiple sites: Secondary | ICD-10-CM | POA: Diagnosis not present

## 2023-11-02 NOTE — Progress Notes (Signed)
 Patient screening at [facility] in [place]. Blood pressure elevated. No SDOH needs indicated.

## 2024-01-26 DIAGNOSIS — I361 Nonrheumatic tricuspid (valve) insufficiency: Secondary | ICD-10-CM | POA: Diagnosis not present

## 2024-01-26 DIAGNOSIS — I5032 Chronic diastolic (congestive) heart failure: Secondary | ICD-10-CM | POA: Diagnosis not present

## 2024-01-26 DIAGNOSIS — I340 Nonrheumatic mitral (valve) insufficiency: Secondary | ICD-10-CM | POA: Diagnosis not present

## 2024-01-26 DIAGNOSIS — I10 Essential (primary) hypertension: Secondary | ICD-10-CM | POA: Diagnosis not present

## 2024-01-26 DIAGNOSIS — I2510 Atherosclerotic heart disease of native coronary artery without angina pectoris: Secondary | ICD-10-CM | POA: Diagnosis not present

## 2024-01-26 DIAGNOSIS — I4821 Permanent atrial fibrillation: Secondary | ICD-10-CM | POA: Diagnosis not present

## 2024-01-26 NOTE — Progress Notes (Signed)
 The patient attended a screening event on 10/30/2023 where her BP screening results was 148/93, blood glucose 108 not fasting. At the event the patient noted she has [employer] and does not smoke. Patient did not have any SDOH insecurities at the event.. Pt listed pcp as Dr Vaughn Pouch, MD. Per chart review pt has a pcp and the last office visit was 10/11/2023 for essential hypertension. In chart review pt problem list is hypertension, pt is currently is on atenolol to manage blood pressure. In chart review there is no future appt indicated for pt. Abnormal letter sent with a curtesy of blood pressure and blood glucose resources in case needed by pt. No additional Health equity team support indicated at this time.

## 2024-02-02 DIAGNOSIS — M7989 Other specified soft tissue disorders: Secondary | ICD-10-CM | POA: Diagnosis not present

## 2024-02-02 DIAGNOSIS — M25569 Pain in unspecified knee: Secondary | ICD-10-CM | POA: Diagnosis not present

## 2024-02-02 DIAGNOSIS — M8580 Other specified disorders of bone density and structure, unspecified site: Secondary | ICD-10-CM | POA: Diagnosis not present

## 2024-02-02 DIAGNOSIS — M0589 Other rheumatoid arthritis with rheumatoid factor of multiple sites: Secondary | ICD-10-CM | POA: Diagnosis not present

## 2024-02-02 DIAGNOSIS — Z79899 Other long term (current) drug therapy: Secondary | ICD-10-CM | POA: Diagnosis not present

## 2024-02-02 DIAGNOSIS — I2510 Atherosclerotic heart disease of native coronary artery without angina pectoris: Secondary | ICD-10-CM | POA: Diagnosis not present

## 2024-02-02 DIAGNOSIS — D696 Thrombocytopenia, unspecified: Secondary | ICD-10-CM | POA: Diagnosis not present

## 2024-02-02 DIAGNOSIS — M150 Primary generalized (osteo)arthritis: Secondary | ICD-10-CM | POA: Diagnosis not present

## 2024-04-13 DIAGNOSIS — H401134 Primary open-angle glaucoma, bilateral, indeterminate stage: Secondary | ICD-10-CM | POA: Diagnosis not present

## 2024-04-13 DIAGNOSIS — H25813 Combined forms of age-related cataract, bilateral: Secondary | ICD-10-CM | POA: Diagnosis not present

## 2024-04-13 DIAGNOSIS — H02831 Dermatochalasis of right upper eyelid: Secondary | ICD-10-CM | POA: Diagnosis not present

## 2024-04-13 DIAGNOSIS — H02834 Dermatochalasis of left upper eyelid: Secondary | ICD-10-CM | POA: Diagnosis not present

## 2024-04-20 ENCOUNTER — Other Ambulatory Visit (HOSPITAL_COMMUNITY): Payer: Self-pay | Admitting: Internal Medicine

## 2024-04-20 DIAGNOSIS — Z1231 Encounter for screening mammogram for malignant neoplasm of breast: Secondary | ICD-10-CM

## 2024-04-25 DIAGNOSIS — M8580 Other specified disorders of bone density and structure, unspecified site: Secondary | ICD-10-CM | POA: Diagnosis not present

## 2024-04-25 DIAGNOSIS — Z23 Encounter for immunization: Secondary | ICD-10-CM | POA: Diagnosis not present

## 2024-04-25 DIAGNOSIS — M150 Primary generalized (osteo)arthritis: Secondary | ICD-10-CM | POA: Diagnosis not present

## 2024-04-25 DIAGNOSIS — Z79899 Other long term (current) drug therapy: Secondary | ICD-10-CM | POA: Diagnosis not present

## 2024-04-25 DIAGNOSIS — D696 Thrombocytopenia, unspecified: Secondary | ICD-10-CM | POA: Diagnosis not present

## 2024-04-25 DIAGNOSIS — I2510 Atherosclerotic heart disease of native coronary artery without angina pectoris: Secondary | ICD-10-CM | POA: Diagnosis not present

## 2024-04-25 DIAGNOSIS — M25569 Pain in unspecified knee: Secondary | ICD-10-CM | POA: Diagnosis not present

## 2024-04-25 DIAGNOSIS — M0589 Other rheumatoid arthritis with rheumatoid factor of multiple sites: Secondary | ICD-10-CM | POA: Diagnosis not present

## 2024-04-25 DIAGNOSIS — M7989 Other specified soft tissue disorders: Secondary | ICD-10-CM | POA: Diagnosis not present

## 2024-05-08 DIAGNOSIS — H25812 Combined forms of age-related cataract, left eye: Secondary | ICD-10-CM | POA: Diagnosis not present

## 2024-05-10 ENCOUNTER — Encounter (HOSPITAL_COMMUNITY)
Admission: RE | Admit: 2024-05-10 | Discharge: 2024-05-10 | Disposition: A | Discharge status: Home / Self Care | Source: Ambulatory Visit | Attending: Ophthalmology | Admitting: Ophthalmology

## 2024-05-10 ENCOUNTER — Encounter (HOSPITAL_COMMUNITY): Payer: Self-pay

## 2024-05-10 ENCOUNTER — Other Ambulatory Visit: Payer: Self-pay

## 2024-05-10 HISTORY — DX: Prediabetes: R73.03

## 2024-05-10 HISTORY — DX: Unspecified osteoarthritis, unspecified site: M19.90

## 2024-05-10 NOTE — H&P (Signed)
 Surgical History & Physical  Patient Name: Penny  [name]  DOB: Jul 16, 1940  Surgery: Cataract extraction with intraocular lens implant phacoemulsification; Left Eye Surgeon: Lynwood Hermann MD Surgery Date: 05/12/2024 Pre-Op Date: 04/13/2024  HPI: A 35 Yr. old female patient 1. The patient is a new patient referred by Dr.Ware for a Cataract Evaluation. The patient complains of difficulty when reading fine print, books, newspaper, instructions etc., which began 1 year ago. Both eyes are affected. The episode is constant. The patient describes foggy and hazy symptoms affecting their eyes/vision. This is negatively affecting the patient's quality of life and the patient is unable to function adequately in life with the current level of vision. HPI was performed by Lynwood Hermann .  Medical History: Cataracts  Arthritis Diabetes Heart Problem High Blood Pressure  Review of Systems Cardiovascular High Blood Pressure Endocrine diabetes Musculoskeletal arthritis All recorded systems are negative except as noted above.  Social Never Smoked  Medication Prednisolone-moxiflox-bromfen,  Folic acid, Eliquis, Metoprolol tartrate, Methotrexate sodium, Jardiance  Sx/Procedures None  Drug Allergies  penicillin   History & Physical: Heent: cataract  NECK: supple without bruits LUNGS: lungs clear to auscultation CV: regular rate and rhythm Abdomen: soft and non-tender  Impression & Plan: Assessment: 1.  COMBINED FORMS AGE RELATED CATARACT; Both Eyes (H25.813) 2.  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  DERMATOCHALASIS, no surgery; Right Upper Lid, Left Upper Lid (H02.831, H02.834) 4.  BLEPHARITIS; Right Upper Lid, Right Lower Lid, Left Upper Lid, Left Lower Lid (H01.001, H01.002,H01.004,H01.005) 5.  ARCUS SENILIS; Both Eyes (H18.413) 6.  ASTIGMATISM, REGULAR; Both Eyes (H52.223)  Plan: 1.  Cataract accounts for the patient's decreased vision. This visual  impairment is not correctable with a tolerable change in glasses or contact lenses. Cataract surgery with an implantation of a new lens should significantly improve the visual and functional status of the patient. Recommend phacoemulsification with intraocular lens. Discussed all risks, benefits, alternatives, and potential complications. Discussed the procedures and recovery. The patient desires to have surgery. A-scan/Biometry ordered and will be performed for intraocular lens calculations. The surgery will be performed in order to improve vision for driving, reading, and for eye examinations. Recommend Dextenza for post-operative pain and inflammation. Educational materials provided: Cataract. History of corneal refractive Surgery: None History of Previous Ocular Surgery (PPV, other): None History of ocular trauma: None Use of Eye Pressure Lowering Drops: None No current contact lens use. Pupil Status: Dilates well - shugarcaine or Lidocaine+Omidira by protocol iAccess goniotomy both eyes at the time of cataract surgery help lower eye pressure. Left Eye worse. OS first, then OD. Recommend LRI OU.  2.  Based on cup to disc ratio. No family history. African-American  Findings, prognosis and treatment options reviewed. Glaucoma is a slow progressive disease often causing painless vision loss. Compliance with treatment discussed, importance of regular follow ups and regular testing understood. Reviewed risks of SLT procedure including increased IOP and inflammation. All questions answered, patient wishes to proceed. Discussed with patient the opportunity to combine a MIGS procedure with their cataract extraction. Risks of the additional procedure reviewed. Patient understands further treatment with topical medication or laser procedures may be indicated. All questions answered. OCT RNFL (OU) 04-13-2024 - thinning OD, borderline OS.  Longitudinal care considerations were addressed as part of this  visit related to the patient's complex and ongoing/chronic medical condition. Extra time was spent with patient discussing the importance of following medical care instructions and recommendations to help achieve positive long-term outcomes.  3.  Asymptomatic, recommend observation for now. Findings, prognosis and treatment options reviewed.  4.  Blepharitis is present - recommend regular lid cleaning.  5.  Discussed significance of finding  6.  Recommend LRI OU.

## 2024-05-12 ENCOUNTER — Encounter (HOSPITAL_COMMUNITY)
Admission: RE | Disposition: A | Discharge status: Home / Self Care | Payer: Self-pay | Source: Home / Self Care | Attending: Ophthalmology

## 2024-05-12 ENCOUNTER — Ambulatory Visit (HOSPITAL_COMMUNITY)
Admission: RE | Admit: 2024-05-12 | Discharge: 2024-05-12 | Disposition: A | Discharge status: Home / Self Care | Attending: Ophthalmology | Admitting: Ophthalmology

## 2024-05-12 ENCOUNTER — Ambulatory Visit (HOSPITAL_COMMUNITY): Admitting: Student

## 2024-05-12 DIAGNOSIS — H18413 Arcus senilis, bilateral: Secondary | ICD-10-CM | POA: Diagnosis not present

## 2024-05-12 DIAGNOSIS — I2510 Atherosclerotic heart disease of native coronary artery without angina pectoris: Secondary | ICD-10-CM

## 2024-05-12 DIAGNOSIS — H5712 Ocular pain, left eye: Secondary | ICD-10-CM | POA: Insufficient documentation

## 2024-05-12 DIAGNOSIS — H401134 Primary open-angle glaucoma, bilateral, indeterminate stage: Secondary | ICD-10-CM | POA: Insufficient documentation

## 2024-05-12 DIAGNOSIS — Z7984 Long term (current) use of oral hypoglycemic drugs: Secondary | ICD-10-CM | POA: Diagnosis not present

## 2024-05-12 DIAGNOSIS — H02831 Dermatochalasis of right upper eyelid: Secondary | ICD-10-CM | POA: Diagnosis not present

## 2024-05-12 DIAGNOSIS — H02834 Dermatochalasis of left upper eyelid: Secondary | ICD-10-CM | POA: Diagnosis not present

## 2024-05-12 DIAGNOSIS — H52223 Regular astigmatism, bilateral: Secondary | ICD-10-CM | POA: Insufficient documentation

## 2024-05-12 DIAGNOSIS — I10 Essential (primary) hypertension: Secondary | ICD-10-CM | POA: Diagnosis not present

## 2024-05-12 DIAGNOSIS — H401124 Primary open-angle glaucoma, left eye, indeterminate stage: Secondary | ICD-10-CM | POA: Diagnosis not present

## 2024-05-12 DIAGNOSIS — Z951 Presence of aortocoronary bypass graft: Secondary | ICD-10-CM | POA: Insufficient documentation

## 2024-05-12 DIAGNOSIS — H25812 Combined forms of age-related cataract, left eye: Secondary | ICD-10-CM | POA: Diagnosis not present

## 2024-05-12 DIAGNOSIS — H25813 Combined forms of age-related cataract, bilateral: Secondary | ICD-10-CM | POA: Insufficient documentation

## 2024-05-12 DIAGNOSIS — H259 Unspecified age-related cataract: Secondary | ICD-10-CM | POA: Diagnosis not present

## 2024-05-12 DIAGNOSIS — E1136 Type 2 diabetes mellitus with diabetic cataract: Secondary | ICD-10-CM | POA: Insufficient documentation

## 2024-05-12 DIAGNOSIS — M069 Rheumatoid arthritis, unspecified: Secondary | ICD-10-CM | POA: Diagnosis not present

## 2024-05-12 HISTORY — PX: CATARACT EXTRACTION W/PHACO: SHX586

## 2024-05-12 HISTORY — PX: GONIOTOMY: SHX7582

## 2024-05-12 HISTORY — PX: INSERTION, STENT, DRUG-ELUTING, LACRIMAL CANALICULUS: SHX7453

## 2024-05-12 LAB — GLUCOSE, CAPILLARY: Glucose-Capillary: 96 mg/dL (ref 70–99)

## 2024-05-12 SURGERY — PHACOEMULSIFICATION, CATARACT, WITH IOL INSERTION
Anesthesia: Monitor Anesthesia Care | Site: Eye | Laterality: Left

## 2024-05-12 MED ORDER — LIDOCAINE HCL 3.5 % OP GEL
1.0000 | Freq: Once | OPHTHALMIC | Status: AC
  Administered 2024-05-12: 1 via OPHTHALMIC

## 2024-05-12 MED ORDER — LIDOCAINE HCL (PF) 1 % IJ SOLN
INTRAMUSCULAR | Status: DC | PRN
  Administered 2024-05-12: 1 mL

## 2024-05-12 MED ORDER — LACTATED RINGERS IV SOLN: INTRAVENOUS | Status: DC

## 2024-05-12 MED ORDER — DEXAMETHASONE 0.4 MG OP INST
VAGINAL_INSERT | OPHTHALMIC | Status: DC | PRN
  Administered 2024-05-12: .4 mg via OPHTHALMIC

## 2024-05-12 MED ORDER — SODIUM HYALURONATE 23MG/ML IO SOSY
PREFILLED_SYRINGE | INTRAOCULAR | Status: DC | PRN
  Administered 2024-05-12: .6 mL via INTRAOCULAR

## 2024-05-12 MED ORDER — BSS IO SOLN
INTRAOCULAR | Status: DC | PRN
  Administered 2024-05-12: 15 mL via INTRAOCULAR

## 2024-05-12 MED ORDER — TROPICAMIDE 1 % OP SOLN
1.0000 [drp] | OPHTHALMIC | Status: AC | PRN
  Administered 2024-05-12 (×3): 1 [drp] via OPHTHALMIC

## 2024-05-12 MED ORDER — SODIUM HYALURONATE 10 MG/ML IO SOLUTION
PREFILLED_SYRINGE | INTRAOCULAR | Status: DC | PRN
  Administered 2024-05-12: .85 mL via INTRAOCULAR

## 2024-05-12 MED ORDER — MOXIFLOXACIN HCL 5 MG/ML IO SOLN
INTRAOCULAR | Status: DC | PRN
  Administered 2024-05-12: .3 mL via INTRACAMERAL

## 2024-05-12 MED ORDER — TETRACAINE HCL 0.5 % OP SOLN
1.0000 [drp] | OPHTHALMIC | Status: AC | PRN
  Administered 2024-05-12 (×3): 1 [drp] via OPHTHALMIC

## 2024-05-12 MED ORDER — STERILE WATER FOR IRRIGATION IR SOLN
Status: DC | PRN
  Administered 2024-05-12: 1

## 2024-05-12 MED ORDER — BSS IO SOLN
INTRAOCULAR | Status: DC | PRN
  Administered 2024-05-12: 500 mL via OPHTHALMIC

## 2024-05-12 MED ORDER — POVIDONE-IODINE 5 % OP SOLN
OPHTHALMIC | Status: DC | PRN
  Administered 2024-05-12: 1 via OPHTHALMIC

## 2024-05-12 MED ORDER — DEXAMETHASONE 0.4 MG OP INST
VAGINAL_INSERT | OPHTHALMIC | Status: AC
  Filled 2024-05-12: qty 1

## 2024-05-12 MED ORDER — PHENYLEPHRINE HCL 2.5 % OP SOLN
1.0000 [drp] | OPHTHALMIC | Status: AC | PRN
  Administered 2024-05-12 (×3): 1 [drp] via OPHTHALMIC

## 2024-05-12 SURGICAL SUPPLY — 13 items
CLIP IPRISM (KITS) IMPLANT
CLOTH BEACON ORANGE TIMEOUT ST (SAFETY) ×1 IMPLANT
EYE SHIELD UNIVERSAL CLEAR (GAUZE/BANDAGES/DRESSINGS) IMPLANT
FEE CATARACT SUITE SIGHTPATH (MISCELLANEOUS) ×1 IMPLANT
GLOVE BIOGEL PI IND STRL 7.0 (GLOVE) ×2 IMPLANT
LENS IOL TECNIS EYHANCE 23.0 (Intraocular Lens) IMPLANT
NDL HYPO 18GX1.5 BLUNT FILL (NEEDLE) ×2 IMPLANT
NEEDLE HYPO 18GX1.5 BLUNT FILL (NEEDLE) ×1 IMPLANT
PAD ARMBOARD POSITIONER FOAM (MISCELLANEOUS) ×1 IMPLANT
SYR TB 1ML LL NO SAFETY (SYRINGE) ×1 IMPLANT
TAPE SURG TRANSPORE 1 IN (GAUZE/BANDAGES/DRESSINGS) IMPLANT
TREPHINE GLAUKO IACCESS TRBCLR (OPHTHALMIC) IMPLANT
WATER STERILE IRR 250ML POUR (IV SOLUTION) ×1 IMPLANT

## 2024-05-12 NOTE — Transfer of Care (Signed)
 Immediate Anesthesia Transfer of Care Note  Patient: Penny [name]  Procedure(s) Performed: PHACOEMULSIFICATION, CATARACT, WITH IOL INSERTION (Left: Eye) INSERTION, STENT, DRUG-ELUTING, LACRIMAL CANALICULUS (Left: Eye) GONIOTOMY (Left: Eye)  Patient Location: PACU  Anesthesia Type:MAC  Level of Consciousness: awake  Airway & Oxygen Therapy: Patient Spontanous Breathing  Post-op Assessment: Report given to RN  Post vital signs: Reviewed  Last Vitals:  Vitals Value Taken Time  BP 150/100   Temp 98   Pulse 74   Resp 15   SpO2 97     Last Pain:  Vitals:   05/12/24 1008  TempSrc: Oral  PainSc: 0-No pain      Patients Stated Pain Goal: 5 (05/12/24 1008)  Complications: There were no known notable events for this encounter.

## 2024-05-12 NOTE — Discharge Instructions (Signed)
 Please discharge patient when stable, will follow up today with Dr. June Leap at the [facility] office immediately following discharge.  Leave shield in place until visit.  All paperwork with discharge instructions will be given at the office.  [facility] Address:  [address]  [place], [place] [address]

## 2024-05-12 NOTE — Anesthesia Preprocedure Evaluation (Signed)
 Anesthesia Evaluation  Patient identified by MRN, date of birth, ID band Patient awake    Reviewed: Allergy & Precautions, H&P , NPO status , Patient's Chart, lab work & pertinent test results, reviewed documented beta blocker date and time   Airway Mallampati: II  TM Distance: >3 FB Neck ROM: full    Dental no notable dental hx. (+) Dental Advisory Given, Teeth Intact   Pulmonary neg pulmonary ROS   Pulmonary exam normal breath sounds clear to auscultation       Cardiovascular hypertension, + CAD and + CABG  Normal cardiovascular exam Rhythm:regular Rate:Normal     Neuro/Psych negative neurological ROS  negative psych ROS   GI/Hepatic negative GI ROS, Neg liver ROS,,,  Endo/Other  prediabetes  Renal/GU negative Renal ROS  negative genitourinary   Musculoskeletal   Abdominal   Peds  Hematology negative hematology ROS (+)   Anesthesia Other Findings RA  Reproductive/Obstetrics negative OB ROS                              Anesthesia Physical Anesthesia Plan  ASA: 3  Anesthesia Plan: MAC   Post-op Pain Management: Minimal or no pain anticipated   Induction:   PONV Risk Score and Plan: Midazolam   Airway Management Planned: Natural Airway and Nasal Cannula  Additional Equipment: None  Intra-op Plan:   Post-operative Plan:   Informed Consent: I have reviewed the patients History and Physical, chart, labs and discussed the procedure including the risks, benefits and alternatives for the proposed anesthesia with the patient or authorized representative who has indicated his/her understanding and acceptance.     Dental Advisory Given  Plan Discussed with: CRNA  Anesthesia Plan Comments:          Anesthesia Quick Evaluation

## 2024-05-12 NOTE — Op Note (Signed)
 Date of procedure: 05/12/24  Pre-operative diagnosis:  1) Visually significant age-related cataract, Left Eye (H25.812) 2) Primary Open Angle Glaucoma, indeterminate Stage, Left Eye  Post-operative diagnosis: 1) Visually significant age-related cataract, Left Eye 2) Primary Open Angle Glaucoma, indeterminate  Stage, Left Eye 3) Pain and inflammation after cataract surgery, Left Eye  Procedure:  1) Removal of cataract via phacoemulsification and insertion of intra-ocular lens Johnson and Johnson DIB00 +23.0D into the capsular bag of the Left Eye 2) Goniotomy treatment of nasal trabecular meshwork with iAccess, Left Eye 3) Placement of Dextenza insert, left lower eyelid  Attending surgeon: Lynwood LABOR. Miles Leyda, MD, MA  Anesthesia: MAC, Topical Akten  Complications: None  Estimated Blood Loss: <57mL (minimal)  Specimens: None  Implants: As above  Indications:  Visually significant age-related cataract, Left Eye; Glaucoma, Left Eye  Procedure:  The patient was seen and identified in the pre-operative area. The operative eye was identified and dilated.  The operative eye was marked.  Topical anesthesia was administered to the operative eye.     The patient was then to the operative suite and placed in the supine position.  A timeout was performed confirming the patient, procedure to be performed, and all other relevant information.   The patient's face was prepped and draped in the usual fashion for intra-ocular surgery.  A lid speculum was placed into the operative eye and the surgical microscope moved into place and focused. An inferotemporal paracentesis was created using a 20 gauge paracentesis blade. Omidria was injected into the anterior chamber. Shugarcaine was injected into the anterior chamber.  Viscoelastic was injected into the anterior chamber.  A temporal clear-corneal main wound incision was created using a 2.49mm microkeratome.  A continuous curvilinear capsulorrhexis was  initiated using an irrigating cystitome and completed using capsulorrhexis forceps.  Hydrodissection and hydrodeliniation were performed.  Viscoelastic was injected into the anterior chamber.  A phacoemulsification handpiece and a chopper as a second instrument were used to remove the nucleus and epinucleus. The irrigation/aspiration handpiece was used to remove any remaining cortical material.   The capsular bag was reinflated with viscoelastic, checked, and found to be intact.  The intraocular lens was inserted into the capsular bag and dialed into place using a Kuglen hook.    The patient's head was repositioned.  The iAccess was used to extensively treat the nasal trabecular meshwork for 90 degrees for a total of 5 goniotomies under gonioscopy.  The patient's head was returned to neutral.  The irrigation/aspiration handpiece was used to remove any remaining viscoelastic.  The clear corneal wound and paracentesis wounds were then hydrated and checked with Weck-Cels to be watertight. 0.1mL of Moxfloxacin was injected into the anterior chamber. The lid-speculum was removed.    The lower punctum was dilated and filled with Provisc. A Dextenza implant was placed in the lower canaliculus without complication.  The drape was removed.  The patient's face was cleaned with a wet and dry 4x4. A clear shield was taped over the eye. The patient was taken to the post-operative care unit in good condition, having tolerated the procedure well.  Post-Op Instructions: The patient will follow up at [facility] for a same day post-operative evaluation and will receive all other orders and instructions.

## 2024-05-12 NOTE — Interval H&P Note (Signed)
 History and Physical Interval Note:  05/12/2024 10:53 AM  Penny  A [name]  has presented today for surgery, with the diagnosis of combined forms age related cataract, left eye primary open angle glaucoma, left eye.  The various methods of treatment have been discussed with the patient and family. After consideration of risks, benefits and other options for treatment, the patient has consented to  Procedure(s) with comments: PHACOEMULSIFICATION, CATARACT, WITH IOL INSERTION (Left) INSERTION, STENT, DRUG-ELUTING, LACRIMAL CANALICULUS (Left) GONIOTOMY (Left) - iAccess goniotomy as a surgical intervention.  The patient's history has been reviewed, patient examined, no change in status, stable for surgery.  I have reviewed the patient's chart and labs.  Questions were answered to the patient's satisfaction.     HARRIE AGENT

## 2024-05-12 NOTE — Anesthesia Postprocedure Evaluation (Signed)
 Anesthesia Post Note  Patient: Penny [name]  Procedure(s) Performed: PHACOEMULSIFICATION, CATARACT, WITH IOL INSERTION (Left: Eye) INSERTION, STENT, DRUG-ELUTING, LACRIMAL CANALICULUS (Left: Eye) GONIOTOMY (Left: Eye)  Patient location during evaluation: Phase II Anesthesia Type: MAC Level of consciousness: awake and alert Pain management: pain level controlled Vital Signs Assessment: post-procedure vital signs reviewed and stable Respiratory status: spontaneous breathing, nonlabored ventilation and respiratory function stable Cardiovascular status: stable Anesthetic complications: no   There were no known notable events for this encounter.   Last Vitals:  Vitals:   05/12/24 1008 05/12/24 1129  BP: (!) 153/89 (!) 151/95  Pulse: 78 73  Resp: 14 19  Temp: 36.5 C 36.6 C  SpO2: 98% 100%    Last Pain:  Vitals:   05/12/24 1129  TempSrc: Oral  PainSc: 0-No pain                 Maxim Bedel L Torrez Renfroe

## 2024-05-15 ENCOUNTER — Encounter (HOSPITAL_COMMUNITY): Payer: Self-pay | Admitting: Ophthalmology

## 2024-05-17 ENCOUNTER — Inpatient Hospital Stay (HOSPITAL_COMMUNITY): Admission: RE | Admit: 2024-05-17 | Source: Ambulatory Visit

## 2024-05-18 ENCOUNTER — Encounter (HOSPITAL_COMMUNITY): Payer: Self-pay

## 2024-05-18 ENCOUNTER — Ambulatory Visit (HOSPITAL_COMMUNITY)
Admission: RE | Admit: 2024-05-18 | Discharge: 2024-05-18 | Disposition: A | Discharge status: Home / Self Care | Source: Ambulatory Visit | Attending: Internal Medicine | Admitting: Internal Medicine

## 2024-05-18 DIAGNOSIS — H25811 Combined forms of age-related cataract, right eye: Secondary | ICD-10-CM | POA: Diagnosis not present

## 2024-05-18 DIAGNOSIS — Z1231 Encounter for screening mammogram for malignant neoplasm of breast: Secondary | ICD-10-CM | POA: Insufficient documentation

## 2024-05-19 NOTE — H&P (Signed)
 Surgical History & Physical  Patient Name: Penny  [name]  DOB: 12-May-1941  Surgery: Cataract extraction with intraocular lens implant phacoemulsification; Right Eye Surgeon: Lynwood Hermann MD Surgery Date: 05/26/2024 Pre-Op Date: 05/18/2024  HPI: A 89 Yr. old female patient 1. The patient is returning after cataract surgery. The left eye is affected. Status post cataract surgery, which began 6 days ago: Since the last visit, the affected area feels improvement. The patient's vision is improved. The condition's severity is constant. Patient is following medication instructions. 2.  The patient is returning for a cataract follow-up of the right eye. Since the last visit, the affected area is tolerating. The patient's vision is blurry. The condition's severity is constant. Patient is not taking medications. This is negatively affecting the patient's quality of life and the patient is unable to function adequately in life with the current level of vision. HPI Completed by Dr. Lynwood Hermann  Medical History: Cataracts  Arthritis Diabetes Heart Problem High Blood Pressure  Review of Systems Cardiovascular High Blood Pressure Endocrine diabetes Musculoskeletal arthritis All recorded systems are negative except as noted above.  Social Never Smoked  Medication Prednisolone-moxiflox-bromfen,  Folic acid, Eliquis, Metoprolol tartrate, Methotrexate sodium, Jardiance  Sx/Procedures Phaco c IOL  Drug Allergies  penicillins   History & Physical: Heent: cataract NECK: supple without bruits LUNGS: lungs clear to auscultation CV: regular rate and rhythm Abdomen: soft and non-tender  Impression & Plan: Assessment: 1.  CATARACT EXTRACTION STATUS; Left Eye (Z98.42) 2.  COMBINED FORMS AGE RELATED CATARACT; Right Eye (H25.811) 3.  PRIMARY OPEN ANGLE GLAUCOMA; Both Eyes Indeterminate (H40.1134)  Plan: 1.  1 week after cataract surgery. Doing well with improved vision and normal eye  pressure. Call with any problems or concerns. Continue Pred-Mox-Brom Combo drop 2x/day for 1 more week.  2.  Cataract accounts for the patient's decreased vision. This visual impairment is not correctable with a tolerable change in glasses or contact lenses. Cataract surgery with an implantation of a new lens should significantly improve the visual and functional status of the patient. Recommend phacoemulsification with intraocular lens. Discussed all risks, benefits, alternatives, and potential complications. Discussed the procedures and recovery. The patient desires to have surgery. A-scan/Biometry ordered and will be performed for intraocular lens calculations. The surgery will be performed in order to improve vision for driving, reading, and for eye examinations. Recommend Dextenza for post-operative pain and inflammation. Educational materials provided: Cataract. History of corneal refractive Surgery: None History of Previous Ocular Surgery (PPV, other): None History of ocular trauma: None Use of Eye Pressure Lowering Drops: None No current contact lens use. Pupil Status: Dilates well - shugarcaine or Lidocaine+Omidira by protocol iAccess goniotomy both eyes at the time of cataract surgery help lower eye pressure. Right Eye  3.  Based on cup to disc ratio. No family history. African-American  Findings, prognosis and treatment options reviewed. Glaucoma is a slow progressive disease often causing painless vision loss. Compliance with treatment discussed, importance of regular follow ups and regular testing understood. Discussed with patient the opportunity to combine a MIGS procedure with their cataract extraction. Risks of the additional procedure reviewed. Patient understands further treatment with topical medication or laser procedures may be indicated. All questions answered. OCT RNFL (OU) 04-13-2024 - thinning OD, borderline OS.  Longitudinal care considerations were addressed as part  of this visit related to the patient's complex and ongoing/chronic medical condition. Extra time was spent with patient discussing the importance of following medical care instructions and recommendations to help achieve  positive long-term outcomes.

## 2024-05-23 ENCOUNTER — Encounter (HOSPITAL_COMMUNITY)
Admission: RE | Admit: 2024-05-23 | Discharge: 2024-05-23 | Disposition: A | Discharge status: Home / Self Care | Source: Ambulatory Visit | Attending: Ophthalmology | Admitting: Ophthalmology

## 2024-05-23 ENCOUNTER — Encounter (HOSPITAL_COMMUNITY): Payer: Self-pay

## 2024-05-26 ENCOUNTER — Ambulatory Visit (HOSPITAL_COMMUNITY): Admitting: Certified Registered"

## 2024-05-26 ENCOUNTER — Ambulatory Visit (HOSPITAL_COMMUNITY)
Admission: RE | Admit: 2024-05-26 | Discharge: 2024-05-26 | Disposition: A | Discharge status: Home / Self Care | Attending: Ophthalmology | Admitting: Ophthalmology

## 2024-05-26 ENCOUNTER — Encounter (HOSPITAL_COMMUNITY): Payer: Self-pay | Admitting: Ophthalmology

## 2024-05-26 ENCOUNTER — Encounter (HOSPITAL_COMMUNITY)
Admission: RE | Disposition: A | Discharge status: Home / Self Care | Payer: Self-pay | Source: Home / Self Care | Attending: Ophthalmology

## 2024-05-26 DIAGNOSIS — Z951 Presence of aortocoronary bypass graft: Secondary | ICD-10-CM | POA: Diagnosis not present

## 2024-05-26 DIAGNOSIS — I2510 Atherosclerotic heart disease of native coronary artery without angina pectoris: Secondary | ICD-10-CM | POA: Diagnosis not present

## 2024-05-26 DIAGNOSIS — I10 Essential (primary) hypertension: Secondary | ICD-10-CM

## 2024-05-26 DIAGNOSIS — H25811 Combined forms of age-related cataract, right eye: Secondary | ICD-10-CM | POA: Diagnosis not present

## 2024-05-26 DIAGNOSIS — H401112 Primary open-angle glaucoma, right eye, moderate stage: Secondary | ICD-10-CM | POA: Diagnosis not present

## 2024-05-26 DIAGNOSIS — M1990 Unspecified osteoarthritis, unspecified site: Secondary | ICD-10-CM | POA: Diagnosis not present

## 2024-05-26 DIAGNOSIS — H5711 Ocular pain, right eye: Secondary | ICD-10-CM | POA: Diagnosis not present

## 2024-05-26 DIAGNOSIS — E1136 Type 2 diabetes mellitus with diabetic cataract: Secondary | ICD-10-CM | POA: Insufficient documentation

## 2024-05-26 HISTORY — PX: GONIOTOMY: SHX7582

## 2024-05-26 HISTORY — PX: INSERTION, STENT, DRUG-ELUTING, LACRIMAL CANALICULUS: SHX7453

## 2024-05-26 HISTORY — PX: CATARACT EXTRACTION W/PHACO: SHX586

## 2024-05-26 SURGERY — PHACOEMULSIFICATION, CATARACT, WITH IOL INSERTION
Anesthesia: Monitor Anesthesia Care | Site: Eye | Laterality: Right

## 2024-05-26 MED ORDER — LACTATED RINGERS IV SOLN: INTRAVENOUS | Status: DC

## 2024-05-26 MED ORDER — PHENYLEPHRINE HCL 2.5 % OP SOLN
1.0000 [drp] | OPHTHALMIC | Status: AC | PRN
  Administered 2024-05-26 (×3): 1 [drp] via OPHTHALMIC

## 2024-05-26 MED ORDER — BSS IO SOLN
INTRAOCULAR | Status: DC | PRN
  Administered 2024-05-26: 15 mL via INTRAOCULAR

## 2024-05-26 MED ORDER — LIDOCAINE HCL 3.5 % OP GEL
1.0000 | Freq: Once | OPHTHALMIC | Status: AC
  Administered 2024-05-26: 1 via OPHTHALMIC

## 2024-05-26 MED ORDER — TETRACAINE HCL 0.5 % OP SOLN
1.0000 [drp] | OPHTHALMIC | Status: AC | PRN
  Administered 2024-05-26 (×3): 1 [drp] via OPHTHALMIC

## 2024-05-26 MED ORDER — LIDOCAINE HCL (PF) 1 % IJ SOLN
INTRAMUSCULAR | Status: DC | PRN
  Administered 2024-05-26: 1 mL

## 2024-05-26 MED ORDER — STERILE WATER FOR IRRIGATION IR SOLN
Status: DC | PRN
  Administered 2024-05-26: 10 mL

## 2024-05-26 MED ORDER — MOXIFLOXACIN HCL 5 MG/ML IO SOLN
INTRAOCULAR | Status: DC | PRN
  Administered 2024-05-26: .3 mL via INTRACAMERAL

## 2024-05-26 MED ORDER — SODIUM HYALURONATE 23MG/ML IO SOSY
PREFILLED_SYRINGE | INTRAOCULAR | Status: DC | PRN
  Administered 2024-05-26: .6 mL via INTRAOCULAR

## 2024-05-26 MED ORDER — DEXAMETHASONE 0.4 MG OP INST
VAGINAL_INSERT | OPHTHALMIC | Status: DC | PRN
Start: 2024-05-26 — End: 2024-05-26
  Administered 2024-05-26: .4 mg via OPHTHALMIC

## 2024-05-26 MED ORDER — SODIUM HYALURONATE 10 MG/ML IO SOLUTION
PREFILLED_SYRINGE | INTRAOCULAR | Status: DC | PRN
  Administered 2024-05-26: .85 mL via INTRAOCULAR

## 2024-05-26 MED ORDER — TROPICAMIDE 1 % OP SOLN
1.0000 [drp] | OPHTHALMIC | Status: AC | PRN
  Administered 2024-05-26 (×3): 1 [drp] via OPHTHALMIC

## 2024-05-26 MED ORDER — PHENYLEPHRINE-KETOROLAC 1-0.3 % IO SOLN
INTRAOCULAR | Status: DC | PRN
  Administered 2024-05-26: 500 mL via OPHTHALMIC

## 2024-05-26 MED ORDER — POVIDONE-IODINE 5 % OP SOLN
OPHTHALMIC | Status: DC | PRN
  Administered 2024-05-26: 1 via OPHTHALMIC

## 2024-05-26 SURGICAL SUPPLY — 13 items
CLOTH BEACON ORANGE TIMEOUT ST (SAFETY) ×1 IMPLANT
EYE SHIELD UNIVERSAL CLEAR (GAUZE/BANDAGES/DRESSINGS) IMPLANT
FEE CATARACT SUITE SIGHTPATH (MISCELLANEOUS) ×1 IMPLANT
GLOVE BIOGEL PI IND STRL 7.0 (GLOVE) ×2 IMPLANT
LENS IOL TECNIS EYHANCE 24.0 (Intraocular Lens) IMPLANT
NDL HYPO 18GX1.5 BLUNT FILL (NEEDLE) ×2 IMPLANT
NEEDLE HYPO 18GX1.5 BLUNT FILL (NEEDLE) ×1 IMPLANT
PAD ARMBOARD POSITIONER FOAM (MISCELLANEOUS) ×1 IMPLANT
SYR TB 1ML LL NO SAFETY (SYRINGE) ×1 IMPLANT
SYSTEM GLAUKOS IPRIME VSCDLVRY (OPHTHALMIC) IMPLANT
TAPE SURG TRANSPORE 1 IN (GAUZE/BANDAGES/DRESSINGS) IMPLANT
TREPHINE GLAUKO IACCESS TRBCLR (OPHTHALMIC) IMPLANT
WATER STERILE IRR 250ML POUR (IV SOLUTION) ×1 IMPLANT

## 2024-05-26 NOTE — Interval H&P Note (Signed)
 History and Physical Interval Note:  05/26/2024 10:01 AM  Penny [name]  has presented today for surgery, with the diagnosis of combined forms age related cataract, right eye primary open angle glaucoma, moderate, right eye.  The various methods of treatment have been discussed with the patient and family. After consideration of risks, benefits and other options for treatment, the patient has consented to  Procedure(s): PHACOEMULSIFICATION, CATARACT, WITH IOL INSERTION (Right) INSERTION, STENT, DRUG-ELUTING, LACRIMAL CANALICULUS (Right) GONIOTOMY, iAccess (Right) as a surgical intervention.  The patient's history has been reviewed, patient examined, no change in status, stable for surgery.  I have reviewed the patient's chart and labs.  Questions were answered to the patient's satisfaction.     HARRIE AGENT

## 2024-05-26 NOTE — Transfer of Care (Addendum)
 Immediate Anesthesia Transfer of Care Note  Patient: Penny [name]  Procedure(s) Performed: PHACOEMULSIFICATION, CATARACT, WITH IOL INSERTION (Right: Eye) INSERTION, STENT, DRUG-ELUTING, LACRIMAL CANALICULUS (Right: Eye) GONIOTOMY, iAccess (Right: Eye)  Patient Location: Short Stay  Anesthesia Type:MAC  Level of Consciousness: awake, alert , and patient cooperative  Airway & Oxygen Therapy: Patient Spontanous Breathing  Post-op Assessment: Report given to RN and Post -op Vital signs reviewed and stable  Post vital signs: Reviewed and stable  Last Vitals:  Vitals Value Taken Time  BP 144/72 05/26/24   10:37  Temp 36.6 05/26/24   10:37  Pulse 82 05/26/24   10:37  Resp 19 05/26/24   10:37  SpO2 100% 05/26/24   10:37    Last Pain:  Vitals:   05/26/24 0921  PainSc: 0-No pain         Complications: No notable events documented.

## 2024-05-26 NOTE — Discharge Instructions (Signed)
 Please discharge patient when stable, will follow up today with Dr. June Leap at the [facility] office immediately following discharge.  Leave shield in place until visit.  All paperwork with discharge instructions will be given at the office.  [facility] Address:  [address]  [place], [place] [address]

## 2024-05-26 NOTE — Anesthesia Procedure Notes (Signed)
 Date/Time: 05/26/2024 10:10 AM  Performed by: Para Jerelene CROME, CRNAOxygen Delivery Method: Nasal cannula

## 2024-05-26 NOTE — Anesthesia Postprocedure Evaluation (Signed)
 Anesthesia Post Note  Patient: Penny [name]  Procedure(s) Performed: PHACOEMULSIFICATION, CATARACT, WITH IOL INSERTION (Right: Eye) INSERTION, STENT, DRUG-ELUTING, LACRIMAL CANALICULUS (Right: Eye) GONIOTOMY, iAccess (Right: Eye)  Patient location during evaluation: Phase II Anesthesia Type: MAC Level of consciousness: awake and alert Pain management: pain level controlled Vital Signs Assessment: post-procedure vital signs reviewed and stable Respiratory status: spontaneous breathing, nonlabored ventilation and respiratory function stable Cardiovascular status: stable Anesthetic complications: no   There were no known notable events for this encounter.   Last Vitals:  Vitals:   05/26/24 0921 05/26/24 1037  BP: (!) 152/94 (!) 144/72  Pulse:  86  Resp: 16 19  Temp: 36.6 C 36.6 C  SpO2: 100% 100%    Last Pain:  Vitals:   05/26/24 1037  TempSrc: Oral  PainSc: 0-No pain                 Penny [name]

## 2024-05-26 NOTE — Anesthesia Preprocedure Evaluation (Signed)
 Anesthesia Evaluation  Patient identified by MRN, date of birth, ID band Patient awake    Reviewed: Allergy & Precautions, H&P , NPO status , Patient's Chart, lab work & pertinent test results, reviewed documented beta blocker date and time   Airway Mallampati: II  TM Distance: >3 FB Neck ROM: full    Dental no notable dental hx. (+) Dental Advisory Given, Teeth Intact   Pulmonary neg pulmonary ROS   Pulmonary exam normal breath sounds clear to auscultation       Cardiovascular hypertension, + CAD and + CABG  Normal cardiovascular exam Rhythm:regular Rate:Normal     Neuro/Psych negative neurological ROS  negative psych ROS   GI/Hepatic negative GI ROS, Neg liver ROS,,,  Endo/Other  prediabetes  Renal/GU negative Renal ROS  negative genitourinary   Musculoskeletal  (+) Arthritis , Osteoarthritis,    Abdominal   Peds  Hematology negative hematology ROS (+)   Anesthesia Other Findings RA  Reproductive/Obstetrics negative OB ROS                              Anesthesia Physical Anesthesia Plan  ASA: 3  Anesthesia Plan: MAC   Post-op Pain Management: Minimal or no pain anticipated   Induction:   PONV Risk Score and Plan: Midazolam   Airway Management Planned: Natural Airway and Nasal Cannula  Additional Equipment: None  Intra-op Plan:   Post-operative Plan:   Informed Consent: I have reviewed the patients History and Physical, chart, labs and discussed the procedure including the risks, benefits and alternatives for the proposed anesthesia with the patient or authorized representative who has indicated his/her understanding and acceptance.     Dental Advisory Given  Plan Discussed with: CRNA  Anesthesia Plan Comments:          Anesthesia Quick Evaluation

## 2024-05-26 NOTE — Op Note (Signed)
 Date of procedure: 05/26/24  Pre-operative diagnosis:  1) Visually significant age-related combined cataract, Right Eye (H25.811) 2) Primary Open Angle Glaucoma, moderate Stage, Right Eye  Post-operative diagnosis: 1) Visually significant age-related cataract, Right Eye 2) Primary Open Angle Glaucoma, moderate Stage, Right Eye 3) Pain and inflammation after cataract surgery, Right  Procedure:  1) Removal of cataract via phacoemulsification and insertion of intra-ocular lens Johnson and Johnson DIB00 +24.0D into the capsular bag of the Right Eye 2) Goniotomy treatment of nasal trabecular meshwork with iAccess, Right Eye 3) Placement of Dextenza  insert, right lower eyelid  Attending surgeon: Lynwood LABOR. Lilinoe Acklin, MD, MA  Anesthesia: MAC, Topical Akten   Complications: None  Estimated Blood Loss: <49mL (minimal)  Specimens: None  Implants: As above  Indications:  Visually significant age-related cataract, Right Eye; Glaucoma, Right Eye  Procedure:  The patient was seen and identified in the pre-operative area. The operative eye was identified and dilated.  The operative eye was marked.  Topical anesthesia was administered to the operative eye.     The patient was then to the operative suite and placed in the supine position.  A timeout was performed confirming the patient, procedure to be performed, and all other relevant information.   The patient's face was prepped and draped in the usual fashion for intra-ocular surgery.  A lid speculum was placed into the operative eye and the surgical microscope moved into place and focused.  A superotemporal paracentesis was created using a 20 gauge paracentesis blade. Omidria  was injected into the anterior chamber. Shugarcaine was injected into the anterior chamber.  Viscoelastic was injected into the anterior chamber.  A temporal clear-corneal main wound incision was created using a 2.65mm microkeratome.  A continuous curvilinear capsulorrhexis was  initiated using an irrigating cystitome and completed using capsulorrhexis forceps.  Hydrodissection and hydrodeliniation were performed.  Viscoelastic was injected into the anterior chamber.  A phacoemulsification handpiece and a chopper as a second instrument were used to remove the nucleus and epinucleus. The irrigation/aspiration handpiece was used to remove any remaining cortical material.   The capsular bag was reinflated with viscoelastic, checked, and found to be intact.  The intraocular lens was inserted into the capsular bag and dialed into place using a Kuglen hook.    The patient's head was repositioned.  The iAccess was used to extensively treat the nasal trabecular meshwork for 90 degrees for a total of 5 goniotomies under gonioscopy.  The patient's head was returned to neutral.  The irrigation/aspiration handpiece was used to remove any remaining viscoelastic.  The clear corneal wound and paracentesis wounds were then hydrated and checked with Weck-Cels to be watertight. 0.1mL of Moxfloxacin was injected into the anterior chamber.  The lid-speculum was removed.    The lower punctum was dilated and filled with Provisc. A Dextenza  implant was placed in the lower canaliculus without complication.  The drape was removed.  The patient's face was cleaned with a wet and dry 4x4. A clear shield was taped over the eye. The patient was taken to the post-operative care unit in good condition, having tolerated the procedure well.  Post-Op Instructions: The patient will follow up at [facility] for a same day post-operative evaluation and will receive all other orders and instructions.
